# Patient Record
Sex: Male | Born: 1970 | Race: Black or African American | Hispanic: No | Marital: Single | Smoking: Never smoker
Health system: Southern US, Community
[De-identification: ages and names within clinical notes are randomized; demographics above are authoritative.]

## PROBLEM LIST (undated history)

## (undated) DIAGNOSIS — N186 End stage renal disease: Secondary | ICD-10-CM

## (undated) DIAGNOSIS — Z992 Dependence on renal dialysis: Secondary | ICD-10-CM

## (undated) DIAGNOSIS — I10 Essential (primary) hypertension: Secondary | ICD-10-CM

## (undated) HISTORY — PX: SP DECLOT AVGG: HXRAD28

---

## 2011-04-28 HISTORY — PX: DIALYSIS FISTULA CREATION: SHX611

## 2014-04-27 HISTORY — PX: SHUNTOGRAM: SHX6095

## 2014-05-23 ENCOUNTER — Ambulatory Visit (HOSPITAL_COMMUNITY)
Admission: RE | Admit: 2014-05-23 | Discharge: 2014-05-23 | Disposition: A | Discharge status: Home / Self Care | Payer: Medicaid Other | Source: Ambulatory Visit | Attending: Nephrology | Admitting: Nephrology

## 2014-05-23 ENCOUNTER — Emergency Department (HOSPITAL_COMMUNITY)
Admission: EM | Admit: 2014-05-23 | Discharge: 2014-05-23 | Disposition: A | Discharge status: Home / Self Care | Payer: Self-pay | Attending: Emergency Medicine | Admitting: Emergency Medicine

## 2014-05-23 ENCOUNTER — Other Ambulatory Visit (HOSPITAL_COMMUNITY): Payer: Self-pay | Admitting: Nephrology

## 2014-05-23 ENCOUNTER — Encounter (HOSPITAL_COMMUNITY): Payer: Self-pay | Admitting: Family Medicine

## 2014-05-23 DIAGNOSIS — T82898A Other specified complication of vascular prosthetic devices, implants and grafts, initial encounter: Secondary | ICD-10-CM | POA: Insufficient documentation

## 2014-05-23 DIAGNOSIS — T85618A Breakdown (mechanical) of other specified internal prosthetic devices, implants and grafts, initial encounter: Secondary | ICD-10-CM

## 2014-05-23 DIAGNOSIS — Y838 Other surgical procedures as the cause of abnormal reaction of the patient, or of later complication, without mention of misadventure at the time of the procedure: Secondary | ICD-10-CM | POA: Diagnosis not present

## 2014-05-23 DIAGNOSIS — Z5321 Procedure and treatment not carried out due to patient leaving prior to being seen by health care provider: Secondary | ICD-10-CM

## 2014-05-23 HISTORY — DX: Essential (primary) hypertension: I10

## 2014-05-23 MED ORDER — FENTANYL CITRATE 0.05 MG/ML IJ SOLN
INTRAMUSCULAR | Status: AC
  Filled 2014-05-23: qty 4

## 2014-05-23 MED ORDER — MIDAZOLAM HCL 2 MG/2ML IJ SOLN
INTRAMUSCULAR | Status: AC
  Filled 2014-05-23: qty 4

## 2014-05-23 MED ORDER — FENTANYL CITRATE 0.05 MG/ML IJ SOLN
INTRAMUSCULAR | Status: AC | PRN
  Administered 2014-05-23: 50 ug via INTRAVENOUS
  Administered 2014-05-23: 25 ug via INTRAVENOUS

## 2014-05-23 MED ORDER — IOHEXOL 300 MG/ML  SOLN
150.0000 mL | Freq: Once | INTRAMUSCULAR | Status: AC | PRN
  Administered 2014-05-23: 60 mL via INTRAVENOUS

## 2014-05-23 MED ORDER — LIDOCAINE HCL 1 % IJ SOLN
INTRAMUSCULAR | Status: AC
  Filled 2014-05-23: qty 20

## 2014-05-23 NOTE — Progress Notes (Signed)
Pt transported to Short Stay, P5, via wheelchair and stated he needed to leave as soon as his ride arrived and was trying to put on his clothing while the short stay nurse was trying to get his vitals.  Pt had been told in IR in my presence that his discharge time was 1800. Pt and brother stated if the ride called he would have to leave.  Pt then showed he was wearing a house arrest monitor and brother stated if he did not leave when ride arrived, he would not have another ride for hours.  Called Dr. Loreta AveWagner and advised.  No new orders, but asked if pt could at least stay until 1745 and have drink prior to discharge. Pt denied nausea, dressing to arm is dry and intact. Nurse taking over care having tech bring pt beverage.

## 2014-05-23 NOTE — ED Notes (Signed)
Pt presents from home via [facility] with c/o fistula issue. Pt reports was in prison x28 years and was recently released and has not had dialysis in 1week.  Pt reports the nurses that cared for him in prison said that the fistula needs to be "cleaned out" before it is used again. Able to palpate bruit, skin is CDI.

## 2014-05-23 NOTE — Sedation Documentation (Signed)
Discussed no current labs or IV access prior to procedure start w/ Dr. Loreta AveWagner.  Pt stated his uncle is supposed to pick him up.  Pt noted to have wetish sounding cough.

## 2014-05-23 NOTE — Procedures (Signed)
Interventional Radiology Procedure Note  Procedure: Left UE graft - o - gram, with outflow stenosis and collateral filling.  Balloon angioplasty of the site of anastamosis with 6mm and 8mm balloon to 18ATM.  Improved outflow, with palpable thrill at the completion of procedure. .  Complications: No immediate Recommendations:  - Ok to use the graft/fistula. - Routine care   Signed,  Yvone NeuJaime S. Loreta AveWagner, DO

## 2014-05-23 NOTE — Sedation Documentation (Signed)
Patient is resting comfortably. 

## 2014-05-23 NOTE — H&P (Signed)
Chief Complaint: Chief Complaint  Patient presents with  . Vascular Access Problem  ESRD  Referring Physician(s): Dr Deterding  History of Present Illness: Andre Gentry is a 44 y.o. male  ESRD Left arm dialysis fistula placed 2013 per records Pt has been released from prison after 28 yrs Has not had dialysis 1 week [facility] Deterding Noted Increased venous pressure Scheduled for shuntogram in IR: + stenosis Now scheduled for angioplasty/stent with possible dialysis catheter placement if needed   Past Medical History  Diagnosis Date  . Hypertension   . Renal failure     Past Surgical History  Procedure Laterality Date  . Dialysis fistula creation Left 2013    LUE    Allergies: Review of patient's allergies indicates no known allergies.  Medications: Prior to Admission medications   Not on File    History reviewed. No pertinent family history.  History   Social History  . Marital Status: Unknown    Spouse Name: N/A    Number of Children: N/A  . Years of Education: N/A   Social History Main Topics  . Smoking status: Never Smoker   . Smokeless tobacco: None  . Alcohol Use: No  . Drug Use: None  . Sexual Activity: None   Other Topics Concern  . None   Social History Narrative  . None     Review of Systems: A 12 point ROS discussed and pertinent positives are indicated in the HPI above.  All other systems are negative.  Review of Systems  Constitutional: Positive for activity change.  Respiratory: Positive for cough. Negative for shortness of breath.   Cardiovascular: Negative for chest pain.  Psychiatric/Behavioral: Negative for behavioral problems and confusion.    Vital Signs: BP 152/97 mmHg  Pulse 81  Temp(Src) 97.5 F (36.4 C) (Oral)  Resp 20  SpO2 100%  Physical Exam  Constitutional: He is oriented to person, place, and time.  Cardiovascular: Normal rate, regular rhythm and normal heart sounds.     Pulmonary/Chest: Effort normal. He has wheezes.  Abdominal: Soft. Bowel sounds are normal. There is no tenderness.  Musculoskeletal: Normal range of motion.  Left arm fistula intact  Neurological: He is alert and oriented to person, place, and time.  Skin: Skin is warm and dry.  Psychiatric: He has a normal mood and affect. His behavior is normal. Judgment and thought content normal.    Imaging: No results found.  Labs:  CBC: No results for input(s): WBC, HGB, HCT, PLT in the last 8760 hours.  COAGS: No results for input(s): INR, APTT in the last 8760 hours.  BMP: No results for input(s): NA, K, CL, CO2, GLUCOSE, BUN, CALCIUM, CREATININE, GFRNONAA, GFRAA in the last 8760 hours.  Invalid input(s): CMP  LIVER FUNCTION TESTS: No results for input(s): BILITOT, AST, ALT, ALKPHOS, PROT, ALBUMIN in the last 8760 hours.  TUMOR MARKERS: No results for input(s): AFPTM, CEA, CA199, CHROMGRNA in the last 8760 hours.  Assessment and Plan:  Increased venous pressure noted in most recent dialysis session shuntogram of L arm dialysis fistula today reveals stenosis Now scheduled for angioplasty/stent placement possible dialysis catheter placement if needed Pt aware of procedure benefits and risks and agreeable to proceed Consent signed andin chart  Thank you for this interesting consult.  I greatly enjoyed meeting Andre Gentry and look forward to participating in their care.  Signed: Kaitlynne Wenz A 05/23/2014, 4:17 PM   I spent a total of 20 minutes face  to face in clinical consultation, greater than 50% of which was counseling/coordinating care for L arm dialysis fistula pta/stnet ot dialysis catheter placement

## 2014-05-23 NOTE — Progress Notes (Signed)
Pt declines to stay for 1 hour, IR nurse called MD, pt may leave when his ride arrives. Pt eating, left arm site dry/ clean, dressing intact. Pt only has one ride available, has ankle bractlet/monitor and has to return by 6pm. Pt is eating.

## 2014-05-23 NOTE — Discharge Instructions (Signed)
°  Refer to this sheet in the next few weeks. These instructions provide you with information on caring for yourself after your procedure. Your caregiver may also give you more specific instructions. Your treatment has been planned according to current medical practices, but problems sometimes occur. Call your caregiver if you have any problems or questions after your procedure. HOME CARE INSTRUCTIONS  You may shower the day after the procedure.Remove the bandage (dressing) and gently wash the site with plain soap and water.Gently pat the site dry.  Do not apply powder or lotion to the site.  Do not submerge the affected site in water for 3 to 5 days.  Inspect the site at least twice daily.  Do not flex or bend the affected arm for 24 hours.  No lifting over 5 pounds (2.3 kg) for 5 days after your procedure.  Do not drive home if you are discharged the same day of the procedure. Have someone else drive you.  You may drive 24 hours after the procedure unless otherwise instructed by your caregiver.  Do not operate machinery or power tools for 24 hours.  A responsible adult should be with you for the first 24 hours after you arrive home. What to expect:  Any bruising will usually fade within 1 to 2 weeks.  Blood that collects in the tissue (hematoma) may be painful to the touch. It should usually decrease in size and tenderness within 1 to 2 weeks. SEEK IMMEDIATE MEDICAL CARE IF:  You have unusual pain at the radial site.  You have redness, warmth, swelling, or pain at the radial site.  You have drainage (other than a small amount of blood on the dressing).  You have chills.  You have a fever or persistent symptoms for more than 72 hours.  You have a fever and your symptoms suddenly get worse.  Your arm becomes pale, cool, tingly, or numb.  You have heavy bleeding from the site. Hold pressure on the site. Document Released: 05/16/2010 Document Revised: 07/06/2011 Document  Reviewed: 05/16/2010 [facility] Patient Information 2015 [employer] [place]. This information is not intended to replace advice given to you by your health care provider. Make sure you discuss any questions you have with your health care provider.

## 2014-05-23 NOTE — Sedation Documentation (Signed)
Dr. Loreta AveWagner discussing procedure outcome w/ patient and answering questions.  Sutures placed to site by MD.

## 2014-05-25 NOTE — ED Provider Notes (Signed)
Pt accidentally came to the ED.  He refused medical screening exam.  Not evaluated as patient.    Andre MoMatthew Cris Gibby, MD 05/25/14 [phone]

## 2015-01-05 ENCOUNTER — Emergency Department (HOSPITAL_COMMUNITY)
Admission: EM | Admit: 2015-01-05 | Discharge: 2015-01-05 | Discharge status: Against Medical Advice | Payer: Medicaid Other | Attending: Emergency Medicine | Admitting: Emergency Medicine

## 2015-01-05 ENCOUNTER — Encounter (HOSPITAL_COMMUNITY): Payer: Self-pay | Admitting: Emergency Medicine

## 2015-01-05 DIAGNOSIS — I120 Hypertensive chronic kidney disease with stage 5 chronic kidney disease or end stage renal disease: Secondary | ICD-10-CM | POA: Diagnosis not present

## 2015-01-05 DIAGNOSIS — Z4931 Encounter for adequacy testing for hemodialysis: Secondary | ICD-10-CM | POA: Diagnosis present

## 2015-01-05 DIAGNOSIS — N186 End stage renal disease: Secondary | ICD-10-CM | POA: Diagnosis not present

## 2015-01-05 LAB — BASIC METABOLIC PANEL
ANION GAP: 23 — AB (ref 5–15)
BUN: 76 mg/dL — ABNORMAL HIGH (ref 6–20)
CALCIUM: 6.9 mg/dL — AB (ref 8.9–10.3)
CO2: 15 mmol/L — ABNORMAL LOW (ref 22–32)
Chloride: 99 mmol/L — ABNORMAL LOW (ref 101–111)
Creatinine, Ser: 24.48 mg/dL — ABNORMAL HIGH (ref 0.61–1.24)
GFR calc non Af Amer: 2 mL/min — ABNORMAL LOW (ref >60)
GFR, EST AFRICAN AMERICAN: 2 mL/min — AB (ref >60)
GLUCOSE: 75 mg/dL (ref 65–99)
Potassium: 7 mmol/L (ref 3.5–5.1)
Sodium: 137 mmol/L (ref 135–145)

## 2015-01-05 LAB — CBC
HCT: 33.5 % — ABNORMAL LOW (ref 39.0–52.0)
HEMOGLOBIN: 11.1 g/dL — AB (ref 13.0–17.0)
MCH: 31.7 pg (ref 26.0–34.0)
MCHC: 33.1 g/dL (ref 30.0–36.0)
MCV: 95.7 fL (ref 78.0–100.0)
Platelets: 156 10*3/uL (ref 150–400)
RBC: 3.5 MIL/uL — AB (ref 4.22–5.81)
RDW: 14.8 % (ref 11.5–15.5)
WBC: 2.5 10*3/uL — ABNORMAL LOW (ref 4.0–10.5)

## 2015-01-05 NOTE — ED Notes (Signed)
No answer from pt in waiting room 

## 2015-01-05 NOTE — ED Notes (Addendum)
Pt reports that he went to dialysis today and they told him that his graft was clotted. Pt alert x 4. Pt reports he had his last dialysis treatment on Tuesday. No Bruit or Thrill present. Pt also reports feeling slightly light headed.

## 2015-01-07 ENCOUNTER — Other Ambulatory Visit: Payer: Self-pay | Admitting: Radiology

## 2015-01-07 ENCOUNTER — Other Ambulatory Visit (HOSPITAL_COMMUNITY): Payer: Self-pay | Admitting: Nephrology

## 2015-01-07 DIAGNOSIS — N186 End stage renal disease: Secondary | ICD-10-CM

## 2015-01-08 ENCOUNTER — Encounter (HOSPITAL_COMMUNITY): Payer: Self-pay

## 2015-01-08 ENCOUNTER — Other Ambulatory Visit: Payer: Self-pay | Admitting: Nephrology

## 2015-01-08 ENCOUNTER — Other Ambulatory Visit (HOSPITAL_COMMUNITY): Payer: Medicaid Other

## 2015-01-08 ENCOUNTER — Other Ambulatory Visit (HOSPITAL_COMMUNITY): Payer: Self-pay | Admitting: Nephrology

## 2015-01-08 ENCOUNTER — Ambulatory Visit (HOSPITAL_COMMUNITY)
Admission: RE | Admit: 2015-01-08 | Discharge: 2015-01-08 | Disposition: A | Discharge status: Home / Self Care | Payer: Medicaid Other | Source: Ambulatory Visit | Attending: Nephrology | Admitting: Nephrology

## 2015-01-08 DIAGNOSIS — Y832 Surgical operation with anastomosis, bypass or graft as the cause of abnormal reaction of the patient, or of later complication, without mention of misadventure at the time of the procedure: Secondary | ICD-10-CM | POA: Insufficient documentation

## 2015-01-08 DIAGNOSIS — I120 Hypertensive chronic kidney disease with stage 5 chronic kidney disease or end stage renal disease: Secondary | ICD-10-CM | POA: Diagnosis not present

## 2015-01-08 DIAGNOSIS — Z992 Dependence on renal dialysis: Secondary | ICD-10-CM | POA: Insufficient documentation

## 2015-01-08 DIAGNOSIS — N186 End stage renal disease: Secondary | ICD-10-CM

## 2015-01-08 DIAGNOSIS — T82868A Thrombosis of vascular prosthetic devices, implants and grafts, initial encounter: Secondary | ICD-10-CM | POA: Insufficient documentation

## 2015-01-08 DIAGNOSIS — Z79899 Other long term (current) drug therapy: Secondary | ICD-10-CM | POA: Insufficient documentation

## 2015-01-08 LAB — BASIC METABOLIC PANEL
Anion gap: 21 — ABNORMAL HIGH (ref 5–15)
BUN: 108 mg/dL — AB (ref 6–20)
CO2: 14 mmol/L — ABNORMAL LOW (ref 22–32)
CREATININE: 29.57 mg/dL — AB (ref 0.61–1.24)
Calcium: 7.1 mg/dL — ABNORMAL LOW (ref 8.9–10.3)
Chloride: 105 mmol/L (ref 101–111)
GFR calc Af Amer: 2 mL/min — ABNORMAL LOW (ref >60)
GFR calc non Af Amer: 1 mL/min — ABNORMAL LOW (ref >60)
Glucose, Bld: 79 mg/dL (ref 65–99)
Potassium: 6.7 mmol/L (ref 3.5–5.1)
SODIUM: 140 mmol/L (ref 135–145)

## 2015-01-08 LAB — PROTIME-INR
INR: 1.24 (ref 0.00–1.49)
Prothrombin Time: 15.8 seconds — ABNORMAL HIGH (ref 11.6–15.2)

## 2015-01-08 LAB — CBC
HCT: 31.4 % — ABNORMAL LOW (ref 39.0–52.0)
Hemoglobin: 10.4 g/dL — ABNORMAL LOW (ref 13.0–17.0)
MCH: 31.3 pg (ref 26.0–34.0)
MCHC: 33.1 g/dL (ref 30.0–36.0)
MCV: 94.6 fL (ref 78.0–100.0)
PLATELETS: 127 10*3/uL — AB (ref 150–400)
RBC: 3.32 MIL/uL — ABNORMAL LOW (ref 4.22–5.81)
RDW: 14.5 % (ref 11.5–15.5)
WBC: 2.2 10*3/uL — AB (ref 4.0–10.5)

## 2015-01-08 LAB — APTT: aPTT: 46 seconds — ABNORMAL HIGH (ref 24–37)

## 2015-01-08 MED ORDER — IOHEXOL 300 MG/ML  SOLN
100.0000 mL | Freq: Once | INTRAMUSCULAR | Status: DC | PRN
  Administered 2015-01-08: 50 mL via INTRAVENOUS
  Filled 2015-01-08: qty 100

## 2015-01-08 MED ORDER — HEPARIN SODIUM (PORCINE) 1000 UNIT/ML IJ SOLN
3000.0000 [IU] | Freq: Once | INTRAMUSCULAR | Status: AC
  Administered 2015-01-08: 3000 [IU] via INTRAVENOUS

## 2015-01-08 MED ORDER — ALTEPLASE 100 MG IV SOLR
4.0000 mg | Freq: Once | INTRAVENOUS | Status: AC
  Administered 2015-01-08: 4 mg
  Filled 2015-01-08: qty 4

## 2015-01-08 MED ORDER — MIDAZOLAM HCL 2 MG/2ML IJ SOLN
INTRAMUSCULAR | Status: AC
Start: 2015-01-08 — End: 2015-01-09
  Filled 2015-01-08: qty 4

## 2015-01-08 MED ORDER — LIDOCAINE HCL 1 % IJ SOLN
INTRAMUSCULAR | Status: AC
  Filled 2015-01-08: qty 20

## 2015-01-08 MED ORDER — MIDAZOLAM HCL 2 MG/2ML IJ SOLN
INTRAMUSCULAR | Status: DC | PRN
  Administered 2015-01-08: 1 mg via INTRAVENOUS
  Administered 2015-01-08: 0.5 mg via INTRAVENOUS

## 2015-01-08 MED ORDER — FENTANYL CITRATE (PF) 100 MCG/2ML IJ SOLN
INTRAMUSCULAR | Status: AC
  Filled 2015-01-08: qty 4

## 2015-01-08 MED ORDER — SODIUM CHLORIDE 0.9 % IV SOLN
Freq: Once | INTRAVENOUS | Status: AC
  Administered 2015-01-08: 11:00:00 via INTRAVENOUS

## 2015-01-08 MED ORDER — HEPARIN SODIUM (PORCINE) 1000 UNIT/ML IJ SOLN
INTRAMUSCULAR | Status: AC
Start: 2015-01-08 — End: 2015-01-09
  Filled 2015-01-08: qty 1

## 2015-01-08 MED ORDER — FENTANYL CITRATE (PF) 100 MCG/2ML IJ SOLN
INTRAMUSCULAR | Status: DC | PRN
  Administered 2015-01-08: 25 ug via INTRAVENOUS
  Administered 2015-01-08: 50 ug via INTRAVENOUS

## 2015-01-08 NOTE — Progress Notes (Signed)
Pt signed off the machine with 30 min left d/t cramping; pt denies dizziness, weakness, n/v; he ambulated off the unit to go home.

## 2015-01-08 NOTE — Sedation Documentation (Signed)
Patient is resting comfortably. 

## 2015-01-08 NOTE — Progress Notes (Signed)
K+ of 6.7 reported to [employer]. No new orders received.

## 2015-01-08 NOTE — H&P (Signed)
Chief Complaint: Patient was seen in consultation today for clotted dialysis graft at the request of Lin,James W  Referring Physician(s): Lin,James W  History of Present Illness: Andre Gentry is a 44 y.o. male with ESRD. He has had a left UE AVG for a few years and it has worked well for the most past. He did have to have PTA of the venous anastomosis in 1/16.  He last had dialysis last Thurs, no issues, but went Saturday and graft was found to be clotted. He is referred for declot procedure PMHx, meds, allergies reviewed. Pt has been NPO today  Past Medical History  Diagnosis Date  . Hypertension   . Renal failure     Past Surgical History  Procedure Laterality Date  . Dialysis fistula creation Left 2013    LUE    Allergies: Review of patient's allergies indicates no known allergies.  Medications: Prior to Admission medications   Medication Sig Start Date End Date Taking? Authorizing Provider  heparin 1000 unit/mL SOLN injection 2,300 Units by Dialysis route every dialysis.   Yes Historical Provider, MD  IRON SUCROSE IV Inject 100 mg into the vein every dialysis (during dialysis every treatment x 5 times, stop date 2/05/29/14).    Historical Provider, MD  metoprolol tartrate (LOPRESSOR) 25 MG tablet Take 25 mg by mouth 2 (two) times daily.    Historical Provider, MD     History reviewed. No pertinent family history.  Social History   Social History  . Marital Status: Unknown    Spouse Name: N/A  . Number of Children: N/A  . Years of Education: N/A   Social History Main Topics  . Smoking status: Never Smoker   . Smokeless tobacco: None  . Alcohol Use: No  . Drug Use: None  . Sexual Activity: Not Asked   Other Topics Concern  . None   Social History Narrative     Review of Systems: A 12 point ROS discussed and pertinent positives are indicated in the HPI above.  All other systems are negative.  Review of Systems  Vital Signs: BP 143/92 mmHg  Pulse  66  Temp(Src) 97.6 F (36.4 C)  Resp 20  Ht 5\' 11"  (1.803 m)  Wt 182 lb 6 oz (82.725 kg)  BMI 25.45 kg/m2  SpO2 100%  Physical Exam  Constitutional: He is oriented to person, place, and time. He appears well-developed and well-nourished. No distress.  HENT:  Head: Normocephalic.  Mouth/Throat: Oropharynx is clear and moist.  Neck: Normal range of motion. No JVD present. No tracheal deviation present.  Cardiovascular: Normal rate, regular rhythm and normal heart sounds.   Pulmonary/Chest: Effort normal and breath sounds normal. No respiratory distress.  Abdominal: Soft.  Musculoskeletal:  (L)Upper arm loop graft palpable, no pulse or thrill.  Neurological: He is alert and oriented to person, place, and time.  Psychiatric: He has a normal mood and affect. Judgment normal.    Mallampati Score:  MD Evaluation Airway: WNL Heart: WNL Abdomen: WNL Chest/ Lungs: WNL ASA  Classification: 3 Mallampati/Airway Score: One  Imaging: No results found.  Labs:  CBC:  Recent Labs  01/05/15 1326 01/08/15 1115  WBC 2.5* 2.2*  HGB 11.1* 10.4*  HCT 33.5* 31.4*  PLT 156 127*    COAGS:  Recent Labs  01/08/15 1115  INR 1.24  APTT 46*    BMP:  Recent Labs  01/05/15 1326 01/08/15 1115  NA 137 140  K 7.0* 6.7*  CL 99* 105  CO2 15* 14*  GLUCOSE 75 79  BUN 76* 108*  CALCIUM 6.9* 7.1*  CREATININE 24.48* 29.57*  GFRNONAA 2* 1*  GFRAA 2* 2*    Assessment and Plan: Clotted left upper arm AVG Discussed thrombolysis/thrombectomy of AV Graft/Fistula, possible angioplasty, possible stent, possible HD catheter placement if necessary. Risks, benefits, use of sedation thoroughly explained.   SignedDAKSH, COATES 01/08/2015, 11:54 AM   I spent a total of 15 minutes in face to face in clinical consultation, greater than 50% of which was counseling/coordinating care for declotting of AV graft

## 2015-01-08 NOTE — Procedures (Signed)
Technically successful left upper extremity declot procedure.  No immediate complications.  Katherina Right, MD Pager #: [phone]

## 2015-01-08 NOTE — Sedation Documentation (Signed)
Due to pt potassium 6.7 and no hemodialysis since Thursday 01/03/15 pt will be transported to hemodialysis within hospital. Called to [facility], they are not able to accommodate pt this afternoon due to pt volume and time restraints.

## 2015-02-18 ENCOUNTER — Other Ambulatory Visit (HOSPITAL_COMMUNITY): Payer: Self-pay | Admitting: Nephrology

## 2015-02-18 ENCOUNTER — Ambulatory Visit (HOSPITAL_COMMUNITY)
Admission: RE | Admit: 2015-02-18 | Discharge: 2015-02-18 | Disposition: A | Discharge status: Home / Self Care | Payer: Medicaid Other | Source: Ambulatory Visit | Attending: Nephrology | Admitting: Nephrology

## 2015-02-18 ENCOUNTER — Emergency Department (HOSPITAL_COMMUNITY)
Admission: EM | Admit: 2015-02-18 | Discharge: 2015-02-18 | Discharge status: Absent without leave | Payer: Medicaid Other

## 2015-02-18 DIAGNOSIS — N186 End stage renal disease: Secondary | ICD-10-CM

## 2015-02-18 DIAGNOSIS — T82868A Thrombosis of vascular prosthetic devices, implants and grafts, initial encounter: Secondary | ICD-10-CM | POA: Diagnosis not present

## 2015-02-18 DIAGNOSIS — T82858A Stenosis of vascular prosthetic devices, implants and grafts, initial encounter: Secondary | ICD-10-CM | POA: Insufficient documentation

## 2015-02-18 DIAGNOSIS — Y832 Surgical operation with anastomosis, bypass or graft as the cause of abnormal reaction of the patient, or of later complication, without mention of misadventure at the time of the procedure: Secondary | ICD-10-CM | POA: Diagnosis not present

## 2015-02-18 DIAGNOSIS — I120 Hypertensive chronic kidney disease with stage 5 chronic kidney disease or end stage renal disease: Secondary | ICD-10-CM | POA: Diagnosis not present

## 2015-02-18 DIAGNOSIS — Z992 Dependence on renal dialysis: Secondary | ICD-10-CM | POA: Diagnosis not present

## 2015-02-18 LAB — BASIC METABOLIC PANEL
Anion gap: 13 (ref 5–15)
BUN: 41 mg/dL — AB (ref 6–20)
CHLORIDE: 101 mmol/L (ref 101–111)
CO2: 27 mmol/L (ref 22–32)
Calcium: 9.2 mg/dL (ref 8.9–10.3)
Creatinine, Ser: 15.43 mg/dL — ABNORMAL HIGH (ref 0.61–1.24)
GFR calc non Af Amer: 3 mL/min — ABNORMAL LOW (ref >60)
GFR, EST AFRICAN AMERICAN: 4 mL/min — AB (ref >60)
Glucose, Bld: 77 mg/dL (ref 65–99)
POTASSIUM: 5.1 mmol/L (ref 3.5–5.1)
SODIUM: 141 mmol/L (ref 135–145)

## 2015-02-18 LAB — CBC
HCT: 32.4 % — ABNORMAL LOW (ref 39.0–52.0)
Hemoglobin: 10.9 g/dL — ABNORMAL LOW (ref 13.0–17.0)
MCH: 31.3 pg (ref 26.0–34.0)
MCHC: 33.6 g/dL (ref 30.0–36.0)
MCV: 93.1 fL (ref 78.0–100.0)
PLATELETS: 137 10*3/uL — AB (ref 150–400)
RBC: 3.48 MIL/uL — AB (ref 4.22–5.81)
RDW: 14.6 % (ref 11.5–15.5)
WBC: 2.2 10*3/uL — ABNORMAL LOW (ref 4.0–10.5)

## 2015-02-18 LAB — APTT: aPTT: 28 seconds (ref 24–37)

## 2015-02-18 LAB — PROTIME-INR
INR: 1.11 (ref 0.00–1.49)
Prothrombin Time: 14.5 seconds (ref 11.6–15.2)

## 2015-02-18 MED ORDER — LIDOCAINE HCL 1 % IJ SOLN
INTRAMUSCULAR | Status: AC
  Filled 2015-02-18: qty 20

## 2015-02-18 MED ORDER — MIDAZOLAM HCL 2 MG/2ML IJ SOLN
INTRAMUSCULAR | Status: AC
  Filled 2015-02-18: qty 2

## 2015-02-18 MED ORDER — MIDAZOLAM HCL 2 MG/2ML IJ SOLN
INTRAMUSCULAR | Status: AC | PRN
  Administered 2015-02-18: 1 mg via INTRAVENOUS

## 2015-02-18 MED ORDER — FENTANYL CITRATE (PF) 100 MCG/2ML IJ SOLN
INTRAMUSCULAR | Status: AC | PRN
  Administered 2015-02-18 (×2): 50 ug via INTRAVENOUS

## 2015-02-18 MED ORDER — FENTANYL CITRATE (PF) 100 MCG/2ML IJ SOLN
INTRAMUSCULAR | Status: AC
  Filled 2015-02-18: qty 2

## 2015-02-18 MED ORDER — IOHEXOL 300 MG/ML  SOLN
100.0000 mL | Freq: Once | INTRAMUSCULAR | Status: DC | PRN
  Administered 2015-02-18: 50 mL via INTRAVENOUS
  Filled 2015-02-18: qty 100

## 2015-02-18 MED ORDER — SODIUM CHLORIDE 0.9 % IV SOLN
INTRAVENOUS | Status: AC | PRN
  Administered 2015-02-18: 10 mL/h via INTRAVENOUS

## 2015-02-18 MED ORDER — ALTEPLASE 100 MG IV SOLR
4.0000 mg | Freq: Once | INTRAVENOUS | Status: AC
  Administered 2015-02-18: 4 mg
  Filled 2015-02-18: qty 4

## 2015-02-18 MED ORDER — SODIUM CHLORIDE 0.9 % IV SOLN
Freq: Once | INTRAVENOUS | Status: AC
  Administered 2015-02-18: 14:00:00 via INTRAVENOUS

## 2015-02-18 NOTE — Sedation Documentation (Signed)
Patient is resting comfortably. 

## 2015-02-18 NOTE — Sedation Documentation (Signed)
Pt to IR #1 from short stay via stretcher. Awake and alert. In no distress.

## 2015-02-18 NOTE — H&P (Signed)
Chief Complaint: Patient was seen in consultation today for Left upper arm dialysis graft declot at the request of Andre Gentry  Referring Physician(s): Andre Gentry  History of Present Illness: Andre Gentry is a 44 y.o. male   Pt noticed no thrill or pulse in Lt arm graft this am Last used Sat 10/22---no issues Last intervention 01/08/15:IMPRESSION: 1. Technically successful left upper arm AV graft thrombolysis. 2. Successful angioplasty of the venous anastomosis and majority of the venous limb to 7mm diameter. Completion shuntogram demonstrates the venous limb and anastomosis are widely patent. 3. The arterial anastomosis and arterial limb are widely patent. 4. The central venous system is widely patent.  now clotted again Request for thrombolysis/thrombectomy with possible angioplasty/stent placement Possible dialysis catheter placement if needed.  Past Medical History  Diagnosis Date  . Hypertension   . Renal failure     Past Surgical History  Procedure Laterality Date  . Dialysis fistula creation Left 2013    LUE    Allergies: Review of patient's allergies indicates no known allergies.  Medications: Prior to Admission medications   Medication Sig Start Date End Date Taking? Authorizing Provider  heparin 1000 unit/mL SOLN injection 2,300 Units by Dialysis route every dialysis.    Historical Provider, MD  IRON SUCROSE IV Inject 100 mg into the vein every dialysis (during dialysis every treatment x 5 times, stop date 2/05/29/14).    Historical Provider, MD  metoprolol tartrate (LOPRESSOR) 25 MG tablet Take 25 mg by mouth 2 (two) times daily.    Historical Provider, MD     No family history on file.  Social History   Social History  . Marital Status: Unknown    Spouse Name: N/A  . Number of Children: N/A  . Years of Education: N/A   Social History Main Topics  . Smoking status: Never Smoker   . Smokeless tobacco: Not on file  . Alcohol Use: No  .  Drug Use: Not on file  . Sexual Activity: Not on file   Other Topics Concern  . Not on file   Social History Narrative     Review of Systems: A 12 point ROS discussed and pertinent positives are indicated in the HPI above.  All other systems are negative.  Review of Systems  Constitutional: Negative for fever and fatigue.  Respiratory: Negative for cough and shortness of breath.   Neurological: Negative for weakness.  Psychiatric/Behavioral: Negative for behavioral problems and confusion.    Vital Signs: BP 135/94 mmHg  Pulse 69  Temp(Src) 97.5 F (36.4 C) (Oral)  Resp 18  Ht  (1.803 m)  Wt 175 lb (79.379 kg)  BMI 24.42 kg/m2  SpO2 100%  Physical Exam  Constitutional: He is oriented to person, place, and time.  Cardiovascular: Normal rate, regular rhythm and normal heart sounds.   Pulmonary/Chest: Effort normal and breath sounds normal. He has no wheezes.  Abdominal: Soft. Bowel sounds are normal. There is no tenderness.  Musculoskeletal: Normal range of motion.  LUE dialysis graft no thrill/ no pulse  Neurological: He is alert and oriented to person, place, and time.  Skin: Skin is warm and dry.  Psychiatric: He has a normal mood and affect. His behavior is normal. Judgment and thought content normal.  Nursing note and vitals reviewed.   Mallampati Score:  MD Evaluation Airway: WNL Heart: WNL Abdomen: WNL Chest/ Lungs: WNL ASA  Classification: 3 Mallampati/Airway Score: One  Imaging: No results found.  Labs:  CBC:  Recent Labs  01/05/15 1326 01/08/15 1115 02/18/15 1357  WBC 2.5* 2.2* 2.2*  HGB 11.1* 10.4* 10.9*  HCT 33.5* 31.4* 32.4*  PLT 156 127* 137*    COAGS:  Recent Labs  01/08/15 1115  INR 1.24  APTT 46*    BMP:  Recent Labs  01/05/15 1326 01/08/15 1115  NA 137 140  K 7.0* 6.7*  CL 99* 105  CO2 15* 14*  GLUCOSE 75 79  BUN 76* 108*  CALCIUM 6.9* 7.1*  CREATININE 24.48* 29.57*  GFRNONAA 2* 1*  GFRAA 2* 2*     LIVER FUNCTION TESTS: No results for input(s): BILITOT, AST, ALT, ALKPHOS, PROT, ALBUMIN in the last 8760 hours.  TUMOR MARKERS: No results for input(s): AFPTM, CEA, CA199, CHROMGRNA in the last 8760 hours.  Assessment and Plan:  Clotted LUE dialysis graft Now scheduled for thrombolysis/thrombectomy with poss pta/stent. Poss dialysis catheter placement Risks and Benefits discussed with the patient including, but not limited to bleeding, infection, vascular injury, pulmonary embolism, need for tunneled HD catheter placement or even death. All of the patient's questions were answered, patient is agreeable to proceed. Consent signed and in chart.   Thank you for this interesting consult.  I greatly enjoyed meeting Andre Gentry and look forward to participating in their care.  A copy of this report was sent to the requesting provider on this date.  Signed: [place] Gentry A 02/18/2015, 2:33 PM   I spent a total of  30 Minutes   in face to face in clinical consultation, greater than 50% of which was counseling/coordinating care for dialysis graft declot

## 2015-02-18 NOTE — Discharge Instructions (Signed)
Fistulogram, Care After °Refer to this sheet in the next few weeks. These instructions provide you with information on caring for yourself after your procedure. Your health care provider may also give you more specific instructions. Your treatment has been planned according to current medical practices, but problems sometimes occur. Call your health care provider if you have any problems or questions after your procedure. °WHAT TO EXPECT AFTER THE PROCEDURE °After your procedure, it is typical to have the following: °· A small amount of discomfort in the area where the catheters were placed. °· A small amount of bruising around the fistula. °· Sleepiness and fatigue. °HOME CARE INSTRUCTIONS °· Rest at home for the day following your procedure. °· Do not drive or operate heavy machinery while taking pain medicine. °· Take medicines only as directed by your health care provider. °· Do not take baths, swim, or use a hot tub until your health care provider approves. You may shower 24 hours after the procedure or as directed by your health care provider. °· There are many different ways to close and cover an incision, including stitches, skin glue, and adhesive strips. Follow your health care provider's instructions on: °¨ Incision care. °¨ Bandage (dressing) changes and removal. °¨ Incision closure removal. °· Monitor your dialysis fistula carefully. °SEEK MEDICAL CARE IF: °· You have drainage, redness, swelling, or pain at your catheter site. °· You have a fever. °· You have chills. °SEEK IMMEDIATE MEDICAL CARE IF: °· You feel weak. °· You have trouble balancing. °· You have trouble moving your arms or legs. °· You have problems with your speech or vision. °· You can no longer feel a vibration or buzz when you put your fingers over your dialysis fistula. °· The limb that was used for the procedure: °¨ Swells. °¨ Is painful. °¨ Is cold. °¨ Is discolored, such as blue or pale white. °  °This information is not intended  to replace advice given to you by your health care provider. Make sure you discuss any questions you have with your health care provider. °  °Document Released: 08/28/2013 Document Reviewed: 08/28/2013 °Elsevier Interactive Patient Education ©2016 [employer] ° °

## 2015-02-18 NOTE — Sedation Documentation (Signed)
Pt requesting pain pills for after procedure. 50mcg fentanyl given per Dr. Loreta AveWagner.

## 2015-02-18 NOTE — Procedures (Signed)
Interventional Radiology Procedure Note  Procedure: LUE AV graft declot.  7mm & 9mm plasty on the venous end.  7mm plasty on the arterial anastamosis.  No residual stenosis at the conclusion, with palpable thrill. .  Complications: None Recommendations:  - Ok for dialysis - Dialysis may remove the stay sutures.  - 1 hour observation - Routine care  Signed,  Yvone NeuJaime S. Loreta AveWagner, DO

## 2015-03-08 ENCOUNTER — Emergency Department (HOSPITAL_COMMUNITY)
Admission: EM | Admit: 2015-03-08 | Discharge: 2015-03-08 | Disposition: A | Discharge status: Home / Self Care | Payer: Medicaid Other | Attending: Emergency Medicine | Admitting: Emergency Medicine

## 2015-03-08 ENCOUNTER — Emergency Department (HOSPITAL_COMMUNITY): Payer: Medicaid Other

## 2015-03-08 ENCOUNTER — Encounter (HOSPITAL_COMMUNITY): Payer: Self-pay | Admitting: Family Medicine

## 2015-03-08 DIAGNOSIS — Z79899 Other long term (current) drug therapy: Secondary | ICD-10-CM | POA: Diagnosis not present

## 2015-03-08 DIAGNOSIS — T82868A Thrombosis of vascular prosthetic devices, implants and grafts, initial encounter: Secondary | ICD-10-CM

## 2015-03-08 DIAGNOSIS — Z87448 Personal history of other diseases of urinary system: Secondary | ICD-10-CM | POA: Diagnosis not present

## 2015-03-08 DIAGNOSIS — L988 Other specified disorders of the skin and subcutaneous tissue: Secondary | ICD-10-CM | POA: Diagnosis present

## 2015-03-08 DIAGNOSIS — Y658 Other specified misadventures during surgical and medical care: Secondary | ICD-10-CM | POA: Insufficient documentation

## 2015-03-08 DIAGNOSIS — I10 Essential (primary) hypertension: Secondary | ICD-10-CM | POA: Insufficient documentation

## 2015-03-08 MED ORDER — FENTANYL CITRATE (PF) 100 MCG/2ML IJ SOLN
INTRAMUSCULAR | Status: AC
  Filled 2015-03-08: qty 2

## 2015-03-08 MED ORDER — LIDOCAINE HCL 1 % IJ SOLN
INTRAMUSCULAR | Status: AC
  Filled 2015-03-08: qty 20

## 2015-03-08 MED ORDER — HEPARIN SODIUM (PORCINE) 1000 UNIT/ML IJ SOLN
INTRAMUSCULAR | Status: AC
  Filled 2015-03-08: qty 1

## 2015-03-08 MED ORDER — ALTEPLASE 2 MG IJ SOLR
2.0000 mg | Freq: Once | INTRAMUSCULAR | Status: DC
  Filled 2015-03-08: qty 2

## 2015-03-08 MED ORDER — SODIUM CHLORIDE 0.9 % IV SOLN
Freq: Once | INTRAVENOUS | Status: DC
Start: 2015-03-08 — End: 2015-03-08

## 2015-03-08 MED ORDER — IOHEXOL 300 MG/ML  SOLN
100.0000 mL | Freq: Once | INTRAMUSCULAR | Status: DC | PRN
  Administered 2015-03-08: 50 mL via INTRAVENOUS
  Filled 2015-03-08: qty 100

## 2015-03-08 MED ORDER — HEPARIN SODIUM (PORCINE) 1000 UNIT/ML IJ SOLN
3000.0000 [IU] | Freq: Once | INTRAMUSCULAR | Status: AC
  Administered 2015-03-08: 3000 [IU] via INTRA_ARTERIAL

## 2015-03-08 NOTE — Sedation Documentation (Signed)
Patient is resting comfortably. No sedation given. 

## 2015-03-08 NOTE — Consult Note (Signed)
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 CHROMGRNA in the last 8760 hours.  Assessment and Plan:  ESRD Left upper arm dialysis graft clotted Now scheduled for thrombolysis /thrombectomy with possible angioplasty/stent placement Possible dialysis catheter placement Risks and Benefits discussed with the patient including, but not limited to bleeding, infection, vascular injury, pulmonary embolism, need for tunneled HD catheter placement or even death. All of the patient's questions were answered, patient is agreeable to proceed. Consent signed and in chart.      Thank you for this interesting consult.  I greatly enjoyed meeting Andre Gentry and look forward to participating in their care.  A copy of this report was sent to the requesting provider on this date.  Signed: Shaivi Rothschild A 03/08/2015, 1:18 PM   I spent a total of 40 Minutes    in face to face in clinical consultation, greater than 50% of which was counseling/coordinating care for LUE dialysis graft declot

## 2015-03-08 NOTE — ED Notes (Signed)
Pt transported to IR 

## 2015-03-08 NOTE — ED Notes (Signed)
Pt here due to fistula not working. Last dialysis yesterday.
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
## 2015-03-08 NOTE — Discharge Instructions (Signed)
Your fistula was unclogged.  Follow up with your dialysis as previously instructed.  Return with change in fistula function.  AV Fistula, Care After Refer to this sheet in the next few weeks. These instructions provide you with information on caring for yourself after your procedure. Your caregiver may also give you more specific instructions. Your treatment has been planned according to current medical practices, but problems sometimes occur. Call your caregiver if you have any problems or questions after your procedure. HOME CARE INSTRUCTIONS   Do not drive a car or take public transportation alone.  Do not drink alcohol.  Only take medicine that has been prescribed by your caregiver.  Do not sign important papers or make important decisions.  Have a responsible person with you.  Ask your caregiver to show you how to check your access at home for a vibration (called a "thrill") or for a sound (called a "bruit" pronounced brew-ee).  Your vein will need time to enlarge and mature so needles can be inserted for dialysis. Follow your caregiver's instructions about what you need to do to make this happen.  Keep dressings clean and dry.  Keep the arm elevated above your heart. Use a pillow.  Rest.  Use the arm as usual for all activities.  Have the stitches or tape closures removed in 10 to 14 days, or as directed by your caregiver.  Do not sleep or lie on the area of the fistula or that arm. This may decrease or stop the blood flow through your fistula.  Do not allow blood pressures to be taken on this arm.  Do not allow blood drawing to be done from the graft.  Do not wear tight clothing around the access site or on the arm.  Avoid lifting heavy objects with the arm that has the fistula.  Do not use creams or lotions over the access site. SEEK MEDICAL CARE IF:   You have a fever.  You have swelling around the fistula that gets worse, or you have new pain.  You have  unusual bleeding at the fistula site or from any other area.  You have pus or other drainage at the fistula site.  You have skin redness or red streaking on the skin around, above, or below the fistula site.  Your access site feels warm.  You have any flu-like symptoms. SEEK IMMEDIATE MEDICAL CARE IF:   You have pain, numbness, or an unusual pale skin on the hand or on the side of your fistula.  You have dizziness or weakness that you have not had before.  You have shortness of breath.  You have chest pain.  Your fistula disconnects or breaks, and there is bleeding that cannot be easily controlled. Call for local emergency medical help. Do not try to drive yourself to the hospital. MAKE SURE YOU  Understand these instructions.  Will watch your condition.  Will get help right away if you are not doing well or get worse.   This information is not intended to replace advice given to you by your health care provider. Make sure you discuss any questions you have with your health care provider.   Document Released: 04/13/2005 Document Revised: 05/04/2014 Document Reviewed: 10/01/2010 Elsevier Interactive Patient Education [employer] Elsevier Inc.

## 2015-03-08 NOTE — Sedation Documentation (Signed)
Vital signs stable. 

## 2015-03-08 NOTE — Sedation Documentation (Signed)
Patient is resting comfortably. 

## 2015-03-08 NOTE — Procedures (Signed)
Successful LUE AVG DECLOT WITH PTA NO COMP STABLE READY FOR USE FULL REPORT IN PACS

## 2015-04-22 ENCOUNTER — Inpatient Hospital Stay (HOSPITAL_COMMUNITY)
Admission: EM | Admit: 2015-04-22 | Discharge: 2015-04-23 | DRG: 252 | Disposition: A | Discharge status: Home / Self Care | Payer: Medicaid Other | Attending: Internal Medicine | Admitting: Internal Medicine

## 2015-04-22 ENCOUNTER — Encounter (HOSPITAL_COMMUNITY): Payer: Self-pay | Admitting: *Deleted

## 2015-04-22 DIAGNOSIS — I120 Hypertensive chronic kidney disease with stage 5 chronic kidney disease or end stage renal disease: Secondary | ICD-10-CM | POA: Diagnosis present

## 2015-04-22 DIAGNOSIS — N186 End stage renal disease: Secondary | ICD-10-CM | POA: Diagnosis present

## 2015-04-22 DIAGNOSIS — Z992 Dependence on renal dialysis: Secondary | ICD-10-CM

## 2015-04-22 DIAGNOSIS — Z79899 Other long term (current) drug therapy: Secondary | ICD-10-CM | POA: Diagnosis not present

## 2015-04-22 DIAGNOSIS — E875 Hyperkalemia: Secondary | ICD-10-CM | POA: Diagnosis present

## 2015-04-22 DIAGNOSIS — T8249XD Other complication of vascular dialysis catheter, subsequent encounter: Secondary | ICD-10-CM

## 2015-04-22 DIAGNOSIS — Y838 Other surgical procedures as the cause of abnormal reaction of the patient, or of later complication, without mention of misadventure at the time of the procedure: Secondary | ICD-10-CM | POA: Diagnosis present

## 2015-04-22 DIAGNOSIS — T82868A Thrombosis of vascular prosthetic devices, implants and grafts, initial encounter: Secondary | ICD-10-CM | POA: Diagnosis present

## 2015-04-22 DIAGNOSIS — T82898A Other specified complication of vascular prosthetic devices, implants and grafts, initial encounter: Secondary | ICD-10-CM | POA: Diagnosis present

## 2015-04-22 DIAGNOSIS — T82858A Stenosis of vascular prosthetic devices, implants and grafts, initial encounter: Secondary | ICD-10-CM | POA: Diagnosis present

## 2015-04-22 DIAGNOSIS — T8249XA Other complication of vascular dialysis catheter, initial encounter: Secondary | ICD-10-CM | POA: Diagnosis present

## 2015-04-22 HISTORY — DX: Dependence on renal dialysis: Z99.2

## 2015-04-22 HISTORY — DX: End stage renal disease: N18.6

## 2015-04-22 LAB — PROTIME-INR
INR: 1.17 (ref 0.00–1.49)
Prothrombin Time: 15.1 seconds (ref 11.6–15.2)

## 2015-04-22 LAB — POTASSIUM: POTASSIUM: 6.9 mmol/L — AB (ref 3.5–5.1)

## 2015-04-22 LAB — CBC
HEMATOCRIT: 35.9 % — AB (ref 39.0–52.0)
HEMOGLOBIN: 12.2 g/dL — AB (ref 13.0–17.0)
MCH: 32.5 pg (ref 26.0–34.0)
MCHC: 34 g/dL (ref 30.0–36.0)
MCV: 95.7 fL (ref 78.0–100.0)
Platelets: 127 10*3/uL — ABNORMAL LOW (ref 150–400)
RBC: 3.75 MIL/uL — ABNORMAL LOW (ref 4.22–5.81)
RDW: 13.8 % (ref 11.5–15.5)
WBC: 2.5 10*3/uL — AB (ref 4.0–10.5)

## 2015-04-22 LAB — MRSA PCR SCREENING: MRSA by PCR: NEGATIVE

## 2015-04-22 LAB — BASIC METABOLIC PANEL
ANION GAP: 15 (ref 5–15)
BUN: 60 mg/dL — ABNORMAL HIGH (ref 6–20)
CHLORIDE: 100 mmol/L — AB (ref 101–111)
CO2: 25 mmol/L (ref 22–32)
Calcium: 8.8 mg/dL — ABNORMAL LOW (ref 8.9–10.3)
Creatinine, Ser: 17.05 mg/dL — ABNORMAL HIGH (ref 0.61–1.24)
GFR calc non Af Amer: 3 mL/min — ABNORMAL LOW (ref >60)
GFR, EST AFRICAN AMERICAN: 3 mL/min — AB (ref >60)
GLUCOSE: 81 mg/dL (ref 65–99)
POTASSIUM: 6.4 mmol/L — AB (ref 3.5–5.1)
Sodium: 140 mmol/L (ref 135–145)

## 2015-04-22 MED ORDER — SODIUM CHLORIDE 0.9 % IV SOLN: 100.0000 mL | INTRAVENOUS | Status: DC | PRN

## 2015-04-22 MED ORDER — SODIUM CHLORIDE 0.9 % IV SOLN: 1.0000 g | Freq: Once | INTRAVENOUS | Status: DC

## 2015-04-22 MED ORDER — INSULIN ASPART 100 UNIT/ML ~~LOC~~ SOLN
6.0000 [IU] | Freq: Once | SUBCUTANEOUS | Status: AC
  Administered 2015-04-22: 6 [IU] via INTRAVENOUS
  Filled 2015-04-22: qty 1

## 2015-04-22 MED ORDER — HEPARIN SODIUM (PORCINE) 5000 UNIT/ML IJ SOLN
5000.0000 [IU] | Freq: Three times a day (TID) | INTRAMUSCULAR | Status: DC
  Filled 2015-04-22: qty 1

## 2015-04-22 MED ORDER — HYDRALAZINE HCL 50 MG PO TABS
50.0000 mg | ORAL_TABLET | Freq: Three times a day (TID) | ORAL | Status: DC
  Administered 2015-04-22 – 2015-04-23 (×2): 50 mg via ORAL
  Filled 2015-04-22 (×2): qty 1

## 2015-04-22 MED ORDER — LIDOCAINE-PRILOCAINE 2.5-2.5 % EX CREA
1.0000 "application " | TOPICAL_CREAM | CUTANEOUS | Status: DC | PRN
  Filled 2015-04-22: qty 5

## 2015-04-22 MED ORDER — HEPARIN SODIUM (PORCINE) 1000 UNIT/ML DIALYSIS: 1000.0000 [IU] | INTRAMUSCULAR | Status: DC | PRN

## 2015-04-22 MED ORDER — SODIUM BICARBONATE 8.4 % IV SOLN
50.0000 meq | Freq: Once | INTRAVENOUS | Status: AC
  Administered 2015-04-22: 50 meq via INTRAVENOUS
  Filled 2015-04-22: qty 50

## 2015-04-22 MED ORDER — HYDRALAZINE HCL 20 MG/ML IJ SOLN: 10.0000 mg | Freq: Four times a day (QID) | INTRAMUSCULAR | Status: DC | PRN

## 2015-04-22 MED ORDER — GUAIFENESIN-DM 100-10 MG/5ML PO SYRP: 5.0000 mL | ORAL_SOLUTION | ORAL | Status: DC | PRN

## 2015-04-22 MED ORDER — ONDANSETRON HCL 4 MG/2ML IJ SOLN: 4.0000 mg | Freq: Four times a day (QID) | INTRAMUSCULAR | Status: DC | PRN

## 2015-04-22 MED ORDER — SODIUM POLYSTYRENE SULFONATE 15 GM/60ML PO SUSP
30.0000 g | Freq: Once | ORAL | Status: AC
  Administered 2015-04-22: 30 g via ORAL
  Filled 2015-04-22: qty 120

## 2015-04-22 MED ORDER — ALUM & MAG HYDROXIDE-SIMETH 200-200-20 MG/5ML PO SUSP: 30.0000 mL | Freq: Four times a day (QID) | ORAL | Status: DC | PRN

## 2015-04-22 MED ORDER — DEXTROSE 50 % IV SOLN
25.0000 g | Freq: Once | INTRAVENOUS | Status: AC
  Administered 2015-04-22: 25 g via INTRAVENOUS
  Filled 2015-04-22: qty 50

## 2015-04-22 MED ORDER — HYDROCODONE-ACETAMINOPHEN 5-325 MG PO TABS: 1.0000 | ORAL_TABLET | ORAL | Status: DC | PRN

## 2015-04-22 MED ORDER — SODIUM POLYSTYRENE SULFONATE 15 GM/60ML PO SUSP
15.0000 g | Freq: Once | ORAL | Status: AC
  Administered 2015-04-22: 15 g via ORAL
  Filled 2015-04-22: qty 60

## 2015-04-22 MED ORDER — SODIUM CHLORIDE 0.9 % IV SOLN
INTRAVENOUS | Status: DC
  Administered 2015-04-22: 20 mL/h via INTRAVENOUS

## 2015-04-22 MED ORDER — SODIUM CHLORIDE 0.9 % IJ SOLN
3.0000 mL | Freq: Two times a day (BID) | INTRAMUSCULAR | Status: DC
  Administered 2015-04-22: 3 mL via INTRAVENOUS

## 2015-04-22 MED ORDER — LIDOCAINE HCL (PF) 1 % IJ SOLN: 5.0000 mL | INTRAMUSCULAR | Status: DC | PRN

## 2015-04-22 MED ORDER — ONDANSETRON HCL 4 MG PO TABS: 4.0000 mg | ORAL_TABLET | Freq: Four times a day (QID) | ORAL | Status: DC | PRN

## 2015-04-22 MED ORDER — CALCIUM CHLORIDE 10 % IV SOLN
1.0000 g | Freq: Once | INTRAVENOUS | Status: AC
  Administered 2015-04-22: 1 g via INTRAVENOUS
  Filled 2015-04-22: qty 10

## 2015-04-22 MED ORDER — SEVELAMER CARBONATE 800 MG PO TABS
800.0000 mg | ORAL_TABLET | Freq: Three times a day (TID) | ORAL | Status: DC
  Administered 2015-04-22: 800 mg via ORAL
  Filled 2015-04-22: qty 1

## 2015-04-22 MED ORDER — ALTEPLASE 2 MG IJ SOLR
2.0000 mg | Freq: Once | INTRAMUSCULAR | Status: DC | PRN
  Filled 2015-04-22: qty 2

## 2015-04-22 MED ORDER — PENTAFLUOROPROP-TETRAFLUOROETH EX AERO: 1.0000 "application " | INHALATION_SPRAY | CUTANEOUS | Status: DC | PRN

## 2015-04-22 NOTE — H&P (Signed)
Patient Demographics:    Andre Gentry, is a 44 y.o. male  MRN: [id number]   DOB - 1970/08/02  Admit Date - 04/22/2015  Outpatient Primary MD for the patient is No primary care provider on file.   With History of -  Past Medical History  Diagnosis Date  . Hypertension   . Renal failure       Past Surgical History  Procedure Laterality Date  . Dialysis fistula creation Left 2013    LUE   in for   Chief Complaint  Patient presents with  . Vascular Access Problem      HPI:    Andre Gentry  is a 44 y.o. 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 He was sent to the ER. In the ER he is symptom-free however his potassium was found to be 6.9, nephrology was consulted, however sequestered to admit the patient.  He is currently completely symptom free, no headache, no fever chills, no chest pain cough and shortness of breath, no palpitations, no abdominal pain or discomfort, no blood in stool or urine, he is practically anuric, denies any nausea, good appetite, no focal weakness.    Review of systems:    In addition to the HPI above,    No Fever-chills, No Headache, No changes with Vision or hearing, No problems swallowing food or Liquids, No Chest pain, Cough or Shortness of Breath,  No Abdominal pain, No Nausea or Vommitting, Bowel movements are regular, No Blood in stool or Urine, No dysuria, No new skin rashes or bruises, No new joints pains-aches,  No new  weakness, tingling, numbness in any extremity, No recent weight gain or loss,  No polyuria, polydypsia or polyphagia, No significant Mental Stressors.  A full 10 point Review of Systems was done, except as stated above, all other Review of Systems were negative.    Social History:     Social History  Substance Use Topics  . Smoking status: Never Smoker   . Smokeless tobacco: Not on file  . Alcohol Use: No   Lives - at home, mobile and active    Family History :    No H/O ESRD   Home Medications:   Prior to Admission medications   Medication Sig Start Date End Date Taking? Authorizing Provider  sevelamer (RENAGEL) 800 MG tablet Take 24,000 mg by mouth 3 (three) times daily with meals.   Yes Historical Provider, MD     Allergies:    No Known Allergies   Physical Exam:   Vitals  Blood pressure 150/108, pulse 73, resp. rate 14, SpO2  100 %.   1. General young AA male lying in bed in NAD,     2. Normal affect and insight, Not Suicidal or Homicidal, Awake Alert, Oriented X 3.  3. No F.N deficits, ALL C.Nerves Intact, Strength 5/5 all 4 extremities, Sensation intact all 4 extremities, Plantars down going.  4. Ears and Eyes appear Normal, Conjunctivae clear, PERRLA. Moist Oral Mucosa.  5. Supple Neck, No JVD, No cervical lymphadenopathy appriciated, No Carotid Bruits.  6. Symmetrical Chest wall movement, Good air movement bilaterally, CTAB.  7. RRR, No Gallops, Rubs or Murmurs, No Parasternal Heave.  8. Positive Bowel Sounds, Abdomen Soft, No tenderness, No organomegaly appriciated,No rebound -guarding or rigidity.  9.  No Cyanosis, Normal Skin Turgor, No Skin Rash or Bruise.  10. Good muscle tone,  joints appear normal , no effusions, Normal ROM.  11. No Palpable Lymph Nodes in Neck or Axillae      Data Review:    CBC  Recent Labs Lab 04/22/15 1315  WBC 2.5*  HGB 12.2*  HCT 35.9*  PLT 127*  MCV 95.7  MCH 32.5  MCHC 34.0  RDW 13.8    ------------------------------------------------------------------------------------------------------------------  Chemistries   Recent Labs Lab 04/22/15 1315 04/22/15 1415  NA 140  --   K 6.4* 6.9*  CL 100*  --   CO2 25  --   GLUCOSE 81  --   BUN 60*  --   CREATININE 17.05*  --   CALCIUM 8.8*  --    ------------------------------------------------------------------------------------------------------------------ CrCl cannot be calculated (Unknown ideal weight.). ------------------------------------------------------------------------------------------------------------------ No results for input(s): TSH, T4TOTAL, T3FREE, THYROIDAB in the last 72 hours.  Invalid input(s): FREET3   Coagulation profile  Recent Labs Lab 04/22/15 1315  INR 1.17   ------------------------------------------------------------------------------------------------------------------- No results for input(s): DDIMER in the last 72 hours. -------------------------------------------------------------------------------------------------------------------  Cardiac Enzymes No results for input(s): CKMB, TROPONINI, MYOGLOBIN in the last 168 hours.  Invalid input(s): CK ------------------------------------------------------------------------------------------------------------------ Invalid input(s): POCBNP   ---------------------------------------------------------------------------------------------------------------  Urinalysis No results found for: COLORURINE, APPEARANCEUR, LABSPEC, PHURINE, GLUCOSEU, HGBUR, BILIRUBINUR, KETONESUR, PROTEINUR, UROBILINOGEN, NITRITE, LEUKOCYTESUR  ----------------------------------------------------------------------------------------------------------------   Imaging Results:    No results found.  My personal review of EKG: Rhythm NSR,  no Acute ST changes   Assessment & Plan:    1. ESRD with hyperkalemia. Left upper extremity dialysis fistula has  clotted off. Has been seen by renal, hyperkalemia protocol being followed which includes D50, insulin, bicarbonate, Kayexalate, telemetry monitoring. Renal we will arrange for fistula declotting in the morning when he is due for dialysis, he will be placed on renal diet and monitored on telemetry.   2.  Hypertension. We'll place him on hydralazine and monitor.   DVT Prophylaxis Heparin   AM Labs Ordered, also please review Full Orders  Family Communication: Admission, patients condition and plan of care including tests being ordered have been discussed with the patient  who indicates understanding and agree with the plan and Code Status.  Code Status Full  Likely DC to  Home 1-2 days  Condition Fair  Time spent in minutes : 35    SINGH,PRASHANT K M.D on 04/22/2015 at 4:09 PM  Between 7am to 7pm - Pager - [phone]  After 7pm go to [email or web address] - password [email or web address]  [facility] - Office  [phone]

## 2015-04-22 NOTE — ED Notes (Signed)
Admitting at the bedside.  

## 2015-04-22 NOTE — Consult Note (Signed)
Renal Service Consult Note [facility]  Lynnda ShieldsKevin Asay 04/22/2015 Delano MetzSCHERTZ,Alexandera Kuntzman D Requesting Physician: jDr Denton LankSteinl  Reason for Consult:  ESRD patient w clotted AV graft left arm HPI: The patient is a 44 y.o. year-old with hx of HTN and ESRd, on HD since 2012, using LUA for " a couple of years." Presenting with malfunction of AVG, specificially the graft is clotted. It was clotted and repaired by IR twice recently , with a simple declot in mid-Sept, another LUA AVG declot Oct 24th and another AVG declot on Nov 11th.  He says it was working fine for HD on Sat, but when he got home it was clotted.  It hurt for a little while.  NO complaints today.  Pt is in ED and K 6.8, no symptoms.  No EKG changes.    ROS  no SOB, CP  no abd pain, n/v/d  no fever, HA  no rash  Past Medical History  Past Medical History  Diagnosis Date  . Hypertension   . Renal failure    Past Surgical History  Past Surgical History  Procedure Laterality Date  . Dialysis fistula creation Left 2013    LUE   Family History History reviewed. No pertinent family history. Social History  reports that he has never smoked. He does not have any smokeless tobacco history on file. He reports that he does not drink alcohol. His drug history is not on file. Allergies No Known Allergies Home medications Prior to Admission medications   Medication Sig Start Date End Date Taking? Authorizing Provider  sevelamer (RENAGEL) 800 MG tablet Take 24,000 mg by mouth 3 (three) times daily with meals.   Yes Historical Provider, MD   Liver Function Tests No results for input(s): AST, ALT, ALKPHOS, BILITOT, PROT, ALBUMIN in the last 168 hours. No results for input(s): LIPASE, AMYLASE in the last 168 hours. CBC  Recent Labs Lab 04/22/15 1315  WBC 2.5*  HGB 12.2*  HCT 35.9*  MCV 95.7  PLT 127*   Basic Metabolic Panel  Recent Labs Lab 04/22/15 1315 04/22/15 1415  NA 140  --   K 6.4* 6.9*  CL 100*  --   CO2  25  --   GLUCOSE 81  --   BUN 60*  --   CREATININE 17.05*  --   CALCIUM 8.8*  --     Filed Vitals:   04/22/15 1400 04/22/15 1419 04/22/15 1430 04/22/15 1500  BP: 136/107 136/97 131/100 146/99  Pulse: 71 72 67 70  Resp:  12 11 12   SpO2: 100% 100% 99% 100%   Exam Alert, no distress, calm No rash, cyanosis or gangrene Sclera anicteric, throat clear No jvd Chest clear bilat RRR no mrg Abd soft ntnd +bs no mass MS no joint effusions Ext no LE or UE edema Neuro alert ox 3 nonfocal  TTS NGKC   4h 15min   77kg   450/800   2/2.25 bath  Heparin 5000   LUA AVG Calcitriol 2.5 ug tiw Darbe 25 ug / wk Last pth 552, Ca/P 9.5/7.1  Assessment: 1 Clotted AVG 2 Hyperkalemia , no ekg changes 3 ESRD  4 HTN 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  Therefore must be admitted. This is a holiday so IR not open today. Plan admit to medical service/tele bed, give kayexalate tonight, low K diet.  IR consult for declot of AVG ordered.  HD when access established.   Vinson Moselle MD [employer] pager [phone]    cell [phone] 04/22/2015, 3:24 PM

## 2015-04-22 NOTE — ED Notes (Signed)
Pt's bed assignment switched. Attempted to call report x1 on [address]. Unable to take it at this time

## 2015-04-22 NOTE — ED Notes (Addendum)
Pt admits to having dialysis on Saturday, was experiencing left arm pain around his fistula - pt states he no longer feels a thrill in his fistula today. No thrill or bruit noted on exam, CMS intact distal to fistula. Pt denies arm pain at present.

## 2015-04-22 NOTE — ED Provider Notes (Addendum)
CSN: [id number]     Arrival date & time 04/22/15  1148 History   First MD Initiated Contact with Patient 04/22/15 1237     Chief Complaint  Patient presents with  . Vascular Access Problem     (Consider location/radiation/quality/duration/timing/severity/associated sxs/prior Treatment) The history is provided by the patient.  Patient w hx esrd on hd T/TH/Sa, c/o pain around fistula and not feeling thrill since his last dialysis 2 days ago. States had normal hd then [facility]([address]   Hx similar problems/symptoms in past when fistula clotted. No numbness, weakness, or loss of normal function of hand or arm.  No fever or chills. No chest pain or sob. No weakness.      Past Medical History  Diagnosis Date  . Hypertension   . Renal failure    Past Surgical History  Procedure Laterality Date  . Dialysis fistula creation Left 2013    LUE   History reviewed. No pertinent family history. Social History  Substance Use Topics  . Smoking status: Never Smoker   . Smokeless tobacco: None  . Alcohol Use: No    Review of Systems  Constitutional: Negative for fever.  HENT: Negative for sore throat.   Eyes: Negative for redness.  Respiratory: Negative for shortness of breath.   Cardiovascular: Negative for chest pain.  Gastrointestinal: Negative for abdominal pain.  Genitourinary: Negative for flank pain.  Musculoskeletal: Negative for back pain and neck pain.  Skin: Negative for rash.  Neurological: Negative for headaches.  Hematological: Does not bruise/bleed easily.  Psychiatric/Behavioral: Negative for confusion.      Allergies  Review of patient's allergies indicates no known allergies.  Home Medications   Prior to Admission medications   Medication Sig Start Date End Date Taking? Authorizing Provider  sevelamer (RENAGEL) 800 MG tablet Take 24,000 mg by mouth 3 (three) times daily with meals.    Historical Provider, MD   BP 126/99 mmHg  Pulse 73  Resp 18  SpO2  97% Physical Exam  Constitutional: He is oriented to person, place, and time. He appears well-developed and well-nourished. No distress.  HENT:  Head: Atraumatic.  Eyes: Conjunctivae are normal.  Neck: Neck supple. No tracheal deviation present.  Cardiovascular: Normal rate.   Pulmonary/Chest: Effort normal. No accessory muscle usage. No respiratory distress.  Abdominal: He exhibits no distension.  Musculoskeletal: Normal range of motion.  Left upper arm dialyses graft with palp thrill or audible bruit. Radial pulse palp.   Neurological: He is alert and oriented to person, place, and time.  Skin: Skin is warm and dry. He is not diaphoretic.  Psychiatric: He has a normal mood and affect.  Nursing note and vitals reviewed.   ED Course  Procedures (including critical care time) Labs Review  Results for orders placed or performed during the hospital encounter of 04/22/15  CBC  Result Value Ref Range   WBC 2.5 (L) 4.0 - 10.5 K/uL   RBC 3.75 (L) 4.22 - 5.81 MIL/uL   Hemoglobin 12.2 (L) 13.0 - 17.0 g/dL   HCT 40.935.9 (L) 81.139.0 - 91.452.0 %   MCV 95.7 78.0 - 100.0 fL   MCH 32.5 26.0 - 34.0 pg   MCHC 34.0 30.0 - 36.0 g/dL   RDW 78.213.8 95.611.5 - 21.315.5 %   Platelets 127 (L) 150 - 400 K/uL  Basic metabolic panel  Result Value Ref Range   Sodium 140 135 - 145 mmol/L   Potassium 6.4 (HH) 3.5 - 5.1 mmol/L   Chloride 100 (L)  101 - 111 mmol/L   CO2 25 22 - 32 mmol/L   Glucose, Bld 81 65 - 99 mg/dL   BUN 60 (H) 6 - 20 mg/dL   Creatinine, Ser 40.98 (H) 0.61 - 1.24 mg/dL   Calcium 8.8 (L) 8.9 - 10.3 mg/dL   GFR calc non Af Amer 3 (L) >60 mL/min   GFR calc Af Amer 3 (L) >60 mL/min   Anion gap 15 5 - 15  Protime-INR  Result Value Ref Range   Prothrombin Time 15.1 11.6 - 15.2 seconds   INR 1.17 0.00 - 1.49  Potassium  Result Value Ref Range   Potassium 6.9 (HH) 3.5 - 5.1 mmol/L        I have personally reviewed and evaluated these lab results as part of my medical decision-making.    MDM    Labs.  Patients kidney doctor consulted.   Reviewed nursing notes and prior charts for additional history.   On review of prior charts, hx recurrent fistula thrombosis, with IR thrombolysis - will discuss plan w renal.   k high, monitor/ecg.  Redraw for repeat. Cacl iv.  Renal paged.    Discussed pt with Dr Arlean Hopping, including clotted hd graft, due for hd tomorrow, and k high today - he indicates he will re-contact me with plan.   As do not anticipate v rapid hd due to clotted fistula, will give additional meds for high k.  d50 iv, hco3 iv, insulin iv, kayexalate po.   Dr Arlean Hopping indicated to admit to med service, he will arrange IR to do thrombectomy/thrombolysis in AM tomorrow, and do HD after that.   Discussed pt with Dr Burney Gauze, who requests temp orders, tele bed.       Cathren Laine, MD 04/22/15 [phone]

## 2015-04-23 ENCOUNTER — Inpatient Hospital Stay (HOSPITAL_COMMUNITY): Payer: Medicaid Other

## 2015-04-23 LAB — BASIC METABOLIC PANEL
ANION GAP: 17 — AB (ref 5–15)
BUN: 71 mg/dL — ABNORMAL HIGH (ref 6–20)
CALCIUM: 8.5 mg/dL — AB (ref 8.9–10.3)
CO2: 24 mmol/L (ref 22–32)
Chloride: 100 mmol/L — ABNORMAL LOW (ref 101–111)
Creatinine, Ser: 18.5 mg/dL — ABNORMAL HIGH (ref 0.61–1.24)
GFR, EST AFRICAN AMERICAN: 3 mL/min — AB (ref >60)
GFR, EST NON AFRICAN AMERICAN: 3 mL/min — AB (ref >60)
Glucose, Bld: 87 mg/dL (ref 65–99)
POTASSIUM: 5.5 mmol/L — AB (ref 3.5–5.1)
SODIUM: 141 mmol/L (ref 135–145)

## 2015-04-23 LAB — CBC
HCT: 34.2 % — ABNORMAL LOW (ref 39.0–52.0)
HEMOGLOBIN: 11.9 g/dL — AB (ref 13.0–17.0)
MCH: 32.8 pg (ref 26.0–34.0)
MCHC: 34.8 g/dL (ref 30.0–36.0)
MCV: 94.2 fL (ref 78.0–100.0)
Platelets: 121 10*3/uL — ABNORMAL LOW (ref 150–400)
RBC: 3.63 MIL/uL — AB (ref 4.22–5.81)
RDW: 13.8 % (ref 11.5–15.5)
WBC: 2.5 10*3/uL — AB (ref 4.0–10.5)

## 2015-04-23 MED ORDER — IOHEXOL 300 MG/ML  SOLN
100.0000 mL | Freq: Once | INTRAMUSCULAR | Status: AC | PRN
  Administered 2015-04-23: 50 mL via INTRAVENOUS

## 2015-04-23 MED ORDER — HEPARIN SODIUM (PORCINE) 1000 UNIT/ML IJ SOLN
INTRAMUSCULAR | Status: AC
  Filled 2015-04-23: qty 1

## 2015-04-23 MED ORDER — ALTEPLASE 100 MG IV SOLR
4.0000 mg | Freq: Once | INTRAVENOUS | Status: DC
  Filled 2015-04-23: qty 4

## 2015-04-23 MED ORDER — ALTEPLASE 100 MG IV SOLR
INTRAVENOUS | Status: AC | PRN
  Administered 2015-04-23: 4 mg

## 2015-04-23 MED ORDER — HEPARIN SODIUM (PORCINE) 1000 UNIT/ML IJ SOLN
INTRAMUSCULAR | Status: AC | PRN
  Administered 2015-04-23: 4000 [IU] via INTRAVENOUS

## 2015-04-23 MED ORDER — MIDAZOLAM HCL 2 MG/2ML IJ SOLN
INTRAMUSCULAR | Status: AC
  Filled 2015-04-23: qty 4

## 2015-04-23 MED ORDER — LIDOCAINE HCL 1 % IJ SOLN
INTRAMUSCULAR | Status: AC
  Filled 2015-04-23: qty 20

## 2015-04-23 MED ORDER — SODIUM POLYSTYRENE SULFONATE PO POWD: ORAL | Status: AC

## 2015-04-23 MED ORDER — FENTANYL CITRATE (PF) 100 MCG/2ML IJ SOLN
INTRAMUSCULAR | Status: AC
  Filled 2015-04-23: qty 4

## 2015-04-23 MED ORDER — FENTANYL CITRATE (PF) 100 MCG/2ML IJ SOLN
INTRAMUSCULAR | Status: AC | PRN
  Administered 2015-04-23: 100 ug via INTRAVENOUS
  Administered 2015-04-23 (×2): 50 ug via INTRAVENOUS

## 2015-04-23 MED ORDER — MIDAZOLAM HCL 2 MG/2ML IJ SOLN
INTRAMUSCULAR | Status: AC | PRN
  Administered 2015-04-23: 1 mg via INTRAVENOUS
  Administered 2015-04-23: 2 mg via INTRAVENOUS
  Administered 2015-04-23: 1 mg via INTRAVENOUS

## 2015-04-23 NOTE — Progress Notes (Addendum)
  [facility] Progress Note   Subjective: alert, just back from IR w patent AVG s/p declot and stenting  Filed Vitals:   04/23/15 1247 04/23/15 1253 04/23/15 1258 04/23/15 1303  BP: 131/82 112/82 118/90 136/90  Pulse: 84 80 78 86  Temp:      TempSrc:      Resp: 12 9 10 12   Height:      Weight:      SpO2: 100% 100% 100% 100%    Inpatient medications: . alteplase  4 mg Intracatheter Once  . fentaNYL      . heparin      . heparin  5,000 Units Subcutaneous 3 times per day  . hydrALAZINE  50 mg Oral 3 times per day  . lidocaine      . midazolam      . sevelamer carbonate  800 mg Oral TID WC  . sodium chloride  3 mL Intravenous Q12H   . sodium chloride 20 mL/hr (04/22/15 1505)   sodium chloride, sodium chloride, alteplase, alum & mag hydroxide-simeth, guaiFENesin-dextromethorphan, heparin, hydrALAZINE, HYDROcodone-acetaminophen, lidocaine (PF), lidocaine-prilocaine, ondansetron **OR** ondansetron (ZOFRAN) IV, pentafluoroprop-tetrafluoroeth  Exam: Alert, no distress, calm No rash, cyanosis or gangrene Sclera anicteric, throat clear No jvd Chest clear bilat RRR no mrg Abd soft ntnd +bs no mass MS no joint effusions Ext no LE or UE edema Neuro alert ox 3 nonfocal  TTS [facility]KC 4h 15min 77kg 450/800 2/2.25 bath Heparin 5000 LUA AVG Calcitriol 2.5 ug tiw Darbe 25 ug / wk Last pth 552, Ca/P 9.5/7.1  Assessment: 1 Clotted AVG, sp declot/ 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 Patient doesn't want to do inpt HD today, says "I can make it til Thursday doc".  Discussed K and vol restrictions with patient.    Vinson Moselleob Emerson Schreifels MD [facility] pager [phone]    cell [phone] 04/23/2015, 1:41 PM    Recent Labs Lab 04/22/15 1315 04/22/15 1415 04/23/15 0745  NA 140  --  141  K 6.4* 6.9* 5.5*  CL 100*  --  100*  CO2 25  --  24   GLUCOSE 81  --  87  BUN 60*  --  71*  CREATININE 17.05*  --  18.50*  CALCIUM 8.8*  --  8.5*   No results for input(s): AST, ALT, ALKPHOS, BILITOT, PROT, ALBUMIN in the last 168 hours.  Recent Labs Lab 04/22/15 1315 04/23/15 0745  WBC 2.5* 2.5*  HGB 12.2* 11.9*  HCT 35.9* 34.2*  MCV 95.7 94.2  PLT 127* 121*

## 2015-04-23 NOTE — Discharge Instructions (Signed)
Follow with Primary MD  in 3 days   Get CBC, CMP, 2 view Chest X ray checked  by Primary MD next visit.    Activity: As tolerated with Full fall precautions use walker/cane & assistance as needed   Disposition Home    Diet:   Renal Check your Weight same time everyday, if you gain over 2 pounds, or you develop in leg swelling, experience more shortness of breath or chest pain, call your Primary MD immediately. Follow Cardiac Low Salt Diet and 1.2 lit/day fluid restriction.   On your next visit with your primary care physician please Get Medicines reviewed and adjusted.   Please request your Prim.MD to go over all Hospital Tests and Procedure/Radiological results at the follow up, please get all Hospital records sent to your Prim MD by signing hospital release before you go home.   If you experience worsening of your admission symptoms, develop shortness of breath, life threatening emergency, suicidal or homicidal thoughts you must seek medical attention immediately by calling 911 or calling your MD immediately  if symptoms less severe.  You Must read complete instructions/literature along with all the possible adverse reactions/side effects for all the Medicines you take and that have been prescribed to you. Take any new Medicines after you have completely understood and accpet all the possible adverse reactions/side effects.   Do not drive, operating heavy machinery, perform activities at heights, swimming or participation in water activities or provide baby sitting services if your were admitted for syncope or siezures until you have seen by Primary MD or a Neurologist and advised to do so again.  Do not drive when taking Pain medications.    Do not take more than prescribed Pain, Sleep and Anxiety Medications  Special Instructions: If you have smoked or chewed Tobacco  in the last 2 yrs please stop smoking, stop any regular Alcohol  and or any Recreational drug use.  Wear Seat  belts while driving.   Please note  You were cared for by a hospitalist during your hospital stay. If you have any questions about your discharge medications or the care you received while you were in the hospital after you are discharged, you can call the unit and asked to speak with the hospitalist on call if the hospitalist that took care of you is not available. Once you are discharged, your primary care physician will handle any further medical issues. Please note that NO REFILLS for any discharge medications will be authorized once you are discharged, as it is imperative that you return to your primary care physician (or establish a relationship with a primary care physician if you do not have one) for your aftercare needs so that they can reassess your need for medications and monitor your lab values.

## 2015-04-23 NOTE — Procedures (Signed)
Technically successful declot of left upper arm dialysis graft necessitating placement of an uncovered stent at the venous anastomosis secondary to recurrent/refractory stenosis and subsequent graft occlusions.  No immediate post procedural complications.  Katherina RightJay Elorah Dewing, MD Pager #: [phone]

## 2015-04-23 NOTE — Consult Note (Signed)
Chief Complaint: Patient was seen in consultation today for left arm dialysis graft declot Chief Complaint  Patient presents with  . Vascular Access Problem   at the request of Dr Thedore Mins  Referring Physician(s): Dr Arlean Hopping  History of Present Illness: Andre Gentry is Gentry 44 y.o. male   ESRD Pt known to IR Left arm dialysis graft thrombolysis 12/2014; 01/2015; 03/08/2015 Angioplasty 04/2014 03/08/2015: IMPRESSION: Successful left upper arm AV graft thrombectomy, thrombo lysis and venous angioplasty to restore flow. Access ready for use.  Access management: If the access re- occludes prior to 06/08/2015, consider fluency stent placement of the venous anastomosis into the venous limb of the graft.  Pt states was running well even 04/20/15 Noted no thrill 04/21/15 Admitted 12/26 Request made for Left arm dialysis graft thrombolysis/thrombectomy with possible angioplasty/stent placement Possible dialysis catheter placement if needed I have seen and examined pt   Past Medical History  Diagnosis Date  . Hypertension   . ESRD (end stage renal disease) on dialysis Andre Gentry)     on HD since 2012/notes 04/22/2015 "Andre Gentry" (04/22/2015)    Past Surgical History  Procedure Laterality Date  . Dialysis fistula creation Left 2013    LUE  . Sp declot avgg  mid September, 2016; 02/18/2015; 03/08/2015    Andre Gentry 04/22/2015  . Shuntogram Left 04/2014    AVG/notes 05/23/2014    Allergies: Review of patient's allergies indicates no known allergies.  Medications: Prior to Admission medications   Medication Sig Start Date End Date Taking? Authorizing Provider  sevelamer (RENAGEL) 800 MG tablet Take 24,000 mg by mouth 3 (three) times daily with meals.   Yes Historical Provider, MD     History reviewed. No pertinent family history.  Social History   Social History  . Marital Status: Single    Spouse Name: N/Gentry  . Number of Children: N/Gentry  . Years of  Education: N/Gentry   Social History Main Topics  . Smoking status: Never Smoker   . Smokeless tobacco: Never Used  . Alcohol Use: No  . Drug Use: No  . Sexual Activity: Not Asked   Other Topics Concern  . None   Social History Narrative     Review of Systems: Gentry 12 point ROS discussed and pertinent positives are indicated in the HPI above.  All other systems are negative.  Review of Systems  Constitutional: Negative for fever, activity change, appetite change and fatigue.  Respiratory: Negative for shortness of breath.   Gastrointestinal: Negative for abdominal pain.  Neurological: Negative for weakness.  Psychiatric/Behavioral: Negative for behavioral problems and confusion.    Vital Signs: BP 139/99 mmHg  Pulse 79  Temp(Src) 98.5 F (36.9 C) (Oral)  Resp 16  Ht  (1.803 m)  Wt 179 lb 1.6 oz (81.239 kg)  BMI 24.99 kg/m2  SpO2 100%  Physical Exam  Constitutional: He is oriented to person, place, and time.  Cardiovascular: Normal rate, regular rhythm and normal heart sounds.   Pulmonary/Chest: Effort normal and breath sounds normal. He has no wheezes.  Abdominal: Soft. Bowel sounds are normal.  Musculoskeletal: Normal range of motion.  L upper arm dialysis graft No thrill; No pulse  Neurological: He is alert and oriented to person, place, and time.  Skin: Skin is Andre and dry.  Psychiatric: He has Gentry normal mood and affect. His behavior is normal. Judgment and thought content normal.  Nursing note and vitals reviewed.   Mallampati Score:  MD Evaluation Airway:  WNL Heart: WNL Abdomen: WNL Chest/ Lungs: WNL ASA  Classification: 3 Mallampati/Airway Score: One  Imaging: No results found.  Labs:  CBC:  Recent Labs  01/08/15 1115 02/18/15 1357 04/22/15 1315 04/23/15 0745  WBC 2.2* 2.2* 2.5* 2.5*  HGB 10.4* 10.9* 12.2* 11.9*  HCT 31.4* 32.4* 35.9* 34.2*  PLT 127* 137* 127* 121*    COAGS:  Recent Labs  01/08/15 1115 02/18/15 1357  04/22/15 1315  INR 1.24 1.11 1.17  APTT 46* 28  --     BMP:  Recent Labs  01/08/15 1115 02/18/15 1357 04/22/15 1315 04/22/15 1415 04/23/15 0745  NA 140 141 140  --  141  K 6.7* 5.1 6.4* 6.9* 5.5*  CL 105 101 100*  --  100*  CO2 14* 27 25  --  24  GLUCOSE 79 77 81  --  87  BUN 108* 41* 60*  --  71*  CALCIUM 7.1* 9.2 8.8*  --  8.5*  CREATININE 29.57* 15.43* 17.05*  --  18.50*  GFRNONAA 1* 3* 3*  --  3*  GFRAA 2* 4* 3*  --  3*    LIVER FUNCTION TESTS: No results for input(s): BILITOT, AST, ALT, ALKPHOS, PROT, ALBUMIN in the last 8760 hours.  TUMOR MARKERS: No results for input(s): AFPTM, CEA, CA199, CHROMGRNA in the last 8760 hours.  Assessment and Plan:  Left upper arm dialysis graft clotted Scheduled now for thrombolysis/thrombectomy with poss pta/stent Possible dialysis catheter placement Risks and Benefits discussed with the patient including, but not limited to bleeding, infection, vascular injury, pulmonary embolism, need for tunneled HD catheter placement or even death. All of the patient's questions were answered, patient is agreeable to proceed. Consent signed and in chart.   Thank you for this interesting consult.  I greatly enjoyed meeting Andre Gentry and look forward to participating in their care.  Gentry copy of this report was sent to the requesting provider on this date.  Signed: Katheline Brendlinger Gentry 04/23/2015, 10:03 AM   I spent Gentry total of 20 Minutes    in face to face in clinical consultation, greater than 50% of which was counseling/coordinating care for L arm dialysis graft declot

## 2015-04-23 NOTE — Progress Notes (Signed)
Patient Demographics:    Besnik Febus, is a 44 y.o. male, DOB - 08-19-1970, ZOX:[id number]  Admit date - 04/22/2015   Admitting Physician Leroy Sea, MD  Outpatient Primary MD for the patient is No primary care provider on file.  LOS - 1   Chief Complaint  Patient presents with  . Vascular Access Problem        Subjective:    Isadore Palecek today has, No headache, No chest pain, No abdominal pain - No Nausea, No new weakness tingling or numbness, No Cough - SOB.    Assessment  & Plan :    1. ESRD with hyperkalemia. Left upper extremity dialysis fistula has clotted off. Has been seen by renal, hyperkalemia was followed and K has improved, IR to de clot the fistula, then per renal, on Renal diet, T Th,Sat dialysis..   2. Hypertension. On hydralazine PO scheduled and IV PRN monitor.   Code Status : Full  Family Communication  : None  Disposition Plan  : Home 1-2 days  Consults  :  renal  Procedures  :  None  DVT Prophylaxis  :    Heparin    Lab Results  Component Value Date   PLT 121* 04/23/2015    Inpatient Medications  Scheduled Meds: . alteplase  4 mg Intracatheter Once  . heparin  5,000 Units Subcutaneous 3 times per day  . hydrALAZINE  50 mg Oral 3 times per day  . sevelamer carbonate  800 mg Oral TID WC  . sodium chloride  3 mL Intravenous Q12H   Continuous Infusions: . sodium chloride 20 mL/hr (04/22/15 1505)   PRN Meds:.sodium chloride, sodium chloride, alteplase, alum & mag hydroxide-simeth, guaiFENesin-dextromethorphan, heparin, hydrALAZINE, HYDROcodone-acetaminophen, lidocaine (PF), lidocaine-prilocaine, ondansetron **OR** ondansetron (ZOFRAN) IV, pentafluoroprop-tetrafluoroeth  Antibiotics  :     Anti-infectives    None        Objective:   Filed  Vitals:   04/22/15 2006 04/22/15 2100 04/23/15 0500 04/23/15 0855  BP: 140/97  138/105 139/99  Pulse: 80  70 79  Temp: 97.5 F (36.4 C)  98 F (36.7 C) 98.5 F (36.9 C)  TempSrc: Oral  Oral Oral  Resp: Height:   (1.803 m)    Weight: 81.239 kg (179 lb 1.6 oz)     SpO2: 100%  100% 100%    Wt Readings from Last 3 Encounters:  04/22/15 81.239 kg (179 lb 1.6 oz)  02/18/15 79.379 kg (175 lb)  01/08/15 79.9 kg (176 lb 2.4 oz)     Intake/Output Summary (Last 24 hours) at 04/23/15 0954 Last data filed at 04/23/15 0856  Gross per 24 hour  Intake    240 ml  Output      0 ml  Net    240 ml     Physical Exam  Awake Alert, Oriented X 3, No new F.N deficits, Normal affect [place].AT,PERRAL Supple Neck,No JVD, No cervical lymphadenopathy appriciated.  Symmetrical Chest wall movement, Good air movement bilaterally, CTAB RRR,No Gallops,Rubs or new Murmurs, No Parasternal Heave +ve B.Sounds, Abd Soft, No tenderness, No organomegaly appriciated, No rebound - guarding or rigidity. No Cyanosis, Clubbing or edema, No new Rash or bruise  Data Review:   Micro Results Recent Results (from the past 240 hour(s))  MRSA PCR Screening     Status: None   Collection Time: 04/22/15  9:43 PM  Result Value Ref Range Status   MRSA by PCR NEGATIVE NEGATIVE Final    Comment:        The GeneXpert MRSA Assay (FDA approved for NASAL specimens only), is one component of a comprehensive MRSA colonization surveillance program. It is not intended to diagnose MRSA infection nor to guide or monitor treatment for MRSA infections.     Radiology Reports No results found.   CBC  Recent Labs Lab 04/22/15 1315 04/23/15 0745  WBC 2.5* 2.5*  HGB 12.2* 11.9*  HCT 35.9* 34.2*  PLT 127* 121*  MCV 95.7 94.2  MCH 32.5 32.8  MCHC 34.0 34.8  RDW 13.8 13.8    Chemistries   Recent Labs Lab 04/22/15 1315 04/22/15 1415 04/23/15 0745  NA 140  --  141  K 6.4* 6.9* 5.5*  CL 100*   --  100*  CO2 25  --  24  GLUCOSE 81  --  87  BUN 60*  --  71*  CREATININE 17.05*  --  18.50*  CALCIUM 8.8*  --  8.5*   ------------------------------------------------------------------------------------------------------------------ estimated creatinine clearance is 5.4 mL/min (by C-G formula based on Cr of 18.5). ------------------------------------------------------------------------------------------------------------------ No results for input(s): HGBA1C in the last 72 hours. ------------------------------------------------------------------------------------------------------------------ No results for input(s): CHOL, HDL, LDLCALC, TRIG, CHOLHDL, LDLDIRECT in the last 72 hours. ------------------------------------------------------------------------------------------------------------------ No results for input(s): TSH, T4TOTAL, T3FREE, THYROIDAB in the last 72 hours.  Invalid input(s): FREET3 ------------------------------------------------------------------------------------------------------------------ No results for input(s): VITAMINB12, FOLATE, FERRITIN, TIBC, IRON, RETICCTPCT in the last 72 hours.  Coagulation profile  Recent Labs Lab 04/22/15 1315  INR 1.17    No results for input(s): DDIMER in the last 72 hours.  Cardiac Enzymes No results for input(s): CKMB, TROPONINI, MYOGLOBIN in the last 168 hours.  Invalid input(s): CK ------------------------------------------------------------------------------------------------------------------ Invalid input(s): POCBNP   Time Spent in minutes   30   Susa RaringSINGH,Meeyah Ovitt K M.D on 04/23/2015 at 9:54 AM  Between 7am to 7pm - Pager - [phone]  After 7pm go to [email or web address] - password [email or web address]  [facility] -  Office  [phone]

## 2015-04-23 NOTE — Progress Notes (Signed)
Pt ready to go home after IR procedure to open L upper AVG. Told nephrologist he can wait until Thursday for HD. Discharged home with script for one dose of kaexalate. Education provided. Discussed calling dialysis center for possible appointment tommorrowl. Patient agreed. NSL discontinued with catheter intact. Pt escorted out on foot with this RN escorr per pt choice. Bess KindsGWALTNEY, Argyle Gustafson B, RN

## 2015-04-23 NOTE — Discharge Summary (Signed)
Andre Gentry, is a 44 y.o. male  DOB [birth year]  MRN [id number].  Admission date:  04/22/2015  Admitting Physician  Andre Sea, MD  Discharge Date:  04/23/2015   Primary MD  No primary care provider on file.  Recommendations for primary care physician for things to follow:   Outpatient dialysis per schedule, monitor potassium levels closely.   Admission Diagnosis  Clotted dialysis access [facility]) [T82.49XA]   Discharge Diagnosis  Clotted dialysis access [facility]) [T82.49XA]     Principal Problem:   Hyperkalemia Active Problems:   ESRD (end stage renal disease) (HCC)   AV graft thrombosis (HCC)   Clotted dialysis access [facility])      Past Medical History  Diagnosis Date  . Hypertension   . ESRD (end stage renal disease) on dialysis [facility])     on HD since 2012/notes 04/22/2015 "[facility]; [address]; TTS" (04/22/2015)    Past Surgical History  Procedure Laterality Date  . Dialysis fistula creation Left 2013    LUE  . Sp declot avgg  mid September, 2016; 02/18/2015; 03/08/2015    Hattie Perch 04/22/2015  . Shuntogram Left 04/2014    AVG/notes 05/23/2014       HPI  from the history and physical done on the day of admission:   Andre Gentry is a 44 y.o. male, with history of hypertension induced ESRD, now on dialysis, on Tuesday Thursday Saturday schedule, he comes to the hospital after his last dialysis on Saturday when he noticed postdialysis that his left upper extremity sensation had changed, he has a fistula in his left arm, he subsequently contacted his nephrologist and he was found to have clotted left upper extremity fistula. He was sent to the ER. In the ER he is symptom-free however his potassium was found to be 6.9, nephrology was consulted, however sequestered to admit the patient.  He is  currently completely symptom free, no headache, no fever chills, no chest pain cough and shortness of breath, no palpitations, no abdominal pain or discomfort, no blood in stool or urine, he is practically anuric, denies any nausea, good appetite, no focal weakness.     Hospital Course:     1. ESRD with hyperkalemia. Left upper extremity dialysis fistula has clotted off. Has been seen by renal, hyperkalemia protocol being followed which includes D50, insulin, bicarbonate, Kayexalate, potassium level stable this morning, seen by nephrology and IR, his left arm fistula was declotted, he refused dialysis today and wants to be discharged. Seen by renal eye was called by nephrology attending Dr. Vida Roller that patient should be discharged with 1 dose of Kayexalate 30 g to be given tomorrow, he wants to have his dialysis done in the outpatient setting on Thursday.   2. Hypertension. Table off medications monitored in the outpatient setting.      Discharge Condition: Fair  Follow UP  Follow-up Information    Follow up with PCP and Dialysis Center. Schedule an appointment as soon as possible for a visit in 1 day.  Consults obtained - Renal, IR  Diet and Activity recommendation: See Discharge Instructions below  Discharge Instructions       Discharge Instructions    Discharge instructions    Complete by:  As directed   Follow with Primary MD  in 3 days   Get CBC, CMP, 2 view Chest X ray checked  by Primary MD next visit.    Activity: As tolerated with Full fall precautions use walker/cane & assistance as needed   Disposition Home    Diet:   Renal Check your Weight same time everyday, if you gain over 2 pounds, or you develop in leg swelling, experience more shortness of breath or chest pain, call your Primary MD immediately. Follow Cardiac Low Salt Diet and 1.2 lit/day fluid restriction.   On your next visit with your primary care physician please Get Medicines reviewed and  adjusted.   Please request your Prim.MD to go over all Hospital Tests and Procedure/Radiological results at the follow up, please get all Hospital records sent to your Prim MD by signing hospital release before you go home.   If you experience worsening of your admission symptoms, develop shortness of breath, life threatening emergency, suicidal or homicidal thoughts you must seek medical attention immediately by calling 911 or calling your MD immediately  if symptoms less severe.  You Must read complete instructions/literature along with all the possible adverse reactions/side effects for all the Medicines you take and that have been prescribed to you. Take any new Medicines after you have completely understood and accpet all the possible adverse reactions/side effects.   Do not drive, operating heavy machinery, perform activities at heights, swimming or participation in water activities or provide baby sitting services if your were admitted for syncope or siezures until you have seen by Primary MD or a Neurologist and advised to do so again.  Do not drive when taking Pain medications.    Do not take more than prescribed Pain, Sleep and Anxiety Medications  Special Instructions: If you have smoked or chewed Tobacco  in the last 2 yrs please stop smoking, stop any regular Alcohol  and or any Recreational drug use.  Wear Seat belts while driving.   Please note  You were cared for by a hospitalist during your hospital stay. If you have any questions about your discharge medications or the care you received while you were in the hospital after you are discharged, you can call the unit and asked to speak with the hospitalist on call if the hospitalist that took care of you is not available. Once you are discharged, your primary care physician will handle any further medical issues. Please note that NO REFILLS for any discharge medications will be authorized once you are discharged, as it is  imperative that you return to your primary care physician (or establish a relationship with a primary care physician if you do not have one) for your aftercare needs so that they can reassess your need for medications and monitor your lab values.     Increase activity slowly    Complete by:  As directed              Discharge Medications       Medication List    TAKE these medications        sevelamer 800 MG tablet  Commonly known as:  RENAGEL  Take 24,000 mg by mouth 3 (three) times daily with meals.     sodium polystyrene powder  Commonly  known as:  KAYEXALATE  Take 30gms by mouth on 04-24-15        Major procedures and Radiology Reports - PLEASE review detailed and final reports for all details, in brief -       No results found.  Micro Results      Recent Results (from the past 240 hour(s))  MRSA PCR Screening     Status: None   Collection Time: 04/22/15  9:43 PM  Result Value Ref Range Status   MRSA by PCR NEGATIVE NEGATIVE Final    Comment:        The GeneXpert MRSA Assay (FDA approved for NASAL specimens only), is one component of a comprehensive MRSA colonization surveillance program. It is not intended to diagnose MRSA infection nor to guide or monitor treatment for MRSA infections.        Today   Subjective    Andre Gentry today has no headache,no chest abdominal pain,no new weakness tingling or numbness, feels much better wants to go home today.     Objective   Blood pressure 136/90, pulse 86, temperature 98.5 F (36.9 C), temperature source Oral, resp. rate 12, height 5\' 11"  (1.803 m), weight 80.4 kg (177 lb 4 oz), SpO2 100 %.   Intake/Output Summary (Last 24 hours) at 04/23/15 1402 Last data filed at 04/23/15 0856  Gross per 24 hour  Intake    240 ml  Output      0 ml  Net    240 ml    Exam Awake Alert, Oriented x 3, No new F.N deficits, Normal affect Perry.AT,PERRAL Supple Neck,No JVD, No cervical lymphadenopathy  appriciated.  Symmetrical Chest wall movement, Good air movement bilaterally, CTAB RRR,No Gallops,Rubs or new Murmurs, No Parasternal Heave +ve B.Sounds, Abd Soft, Non tender, No organomegaly appriciated, No rebound -guarding or rigidity. No Cyanosis, Clubbing or edema, No new Rash or bruise   Data Review   CBC w Diff: Lab Results  Component Value Date   WBC 2.5* 04/23/2015   HGB 11.9* 04/23/2015   HCT 34.2* 04/23/2015   PLT 121* 04/23/2015    CMP: Lab Results  Component Value Date   NA 141 04/23/2015   K 5.5* 04/23/2015   CL 100* 04/23/2015   CO2 24 04/23/2015   BUN 71* 04/23/2015   CREATININE 18.50* 04/23/2015  .   Total Time in preparing paper work, data evaluation and todays exam - 35 minutes  Andre Gentry,Diamond Jentz K M.D on 04/23/2015 at 2:02 PM  [facility]   Office  [phone]

## 2016-02-28 IMAGING — XA IR SHUNTOGRAM/ FISTULAGRAM
1 series · 13 of 24 positions shown · IV contrast (IODINE)
Comparison: None.

INDICATION: 43-year-old male with a history of renal failure.

He has reported increased venous pressures on dialysis treatments.
He has been referred for evaluation and possible treatment.
EXAM:
IR LEFT SHUNTOGRAM/FISTULAGRAM; PTA VENOUS

[Series 300: dsa  extremities · 13 of 64 slices shown]
[im 1/64]
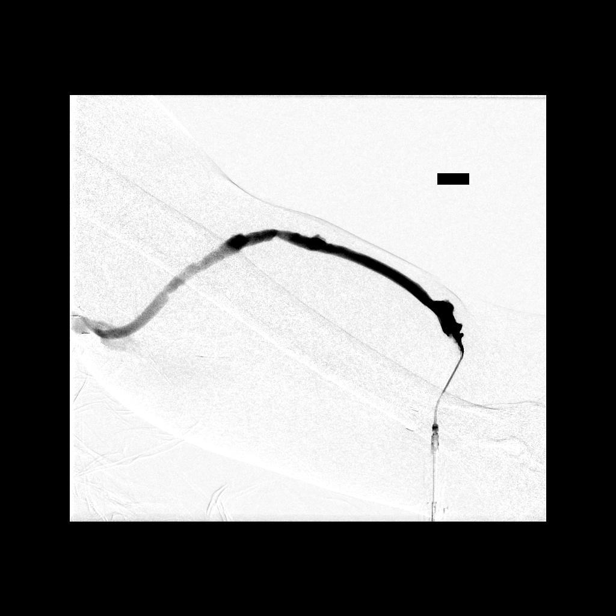
[im 6/64]
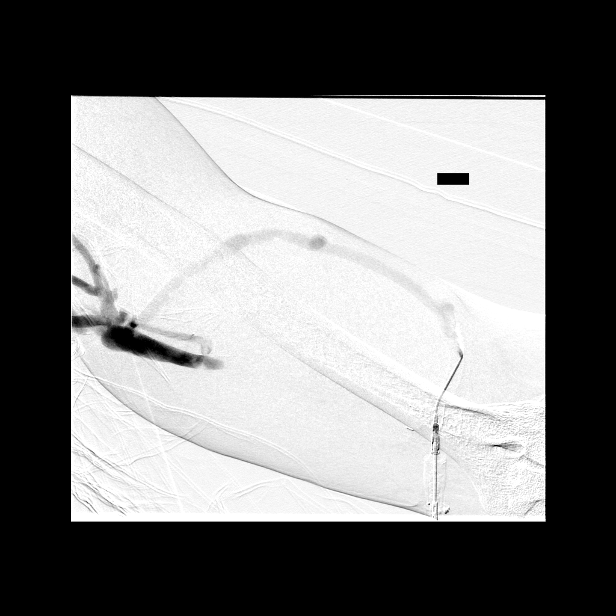
[im 11/64]
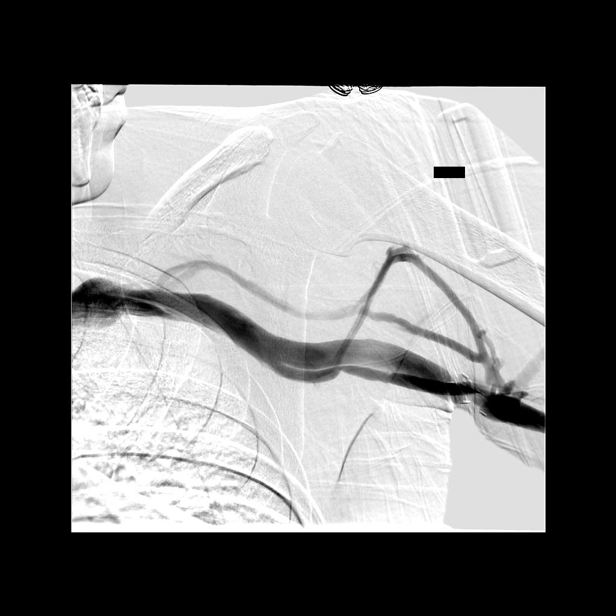
[im 17/64]
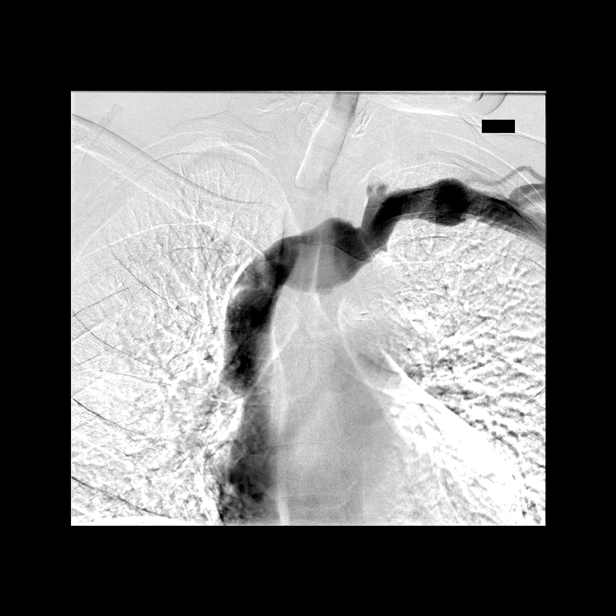
[im 22/64]
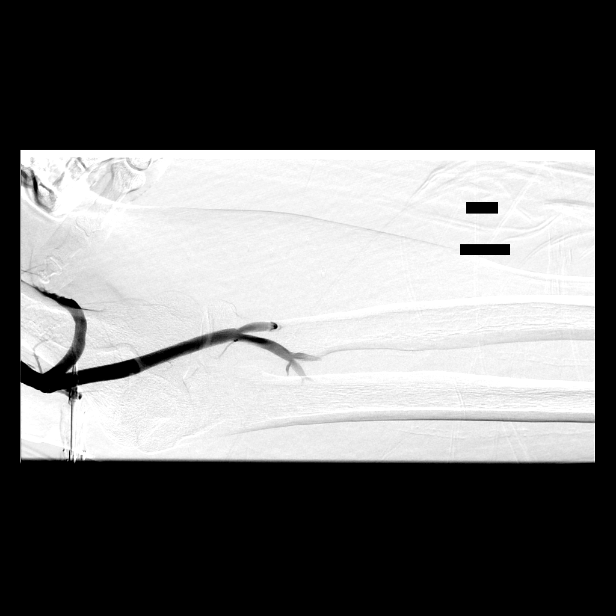
[im 28/64]
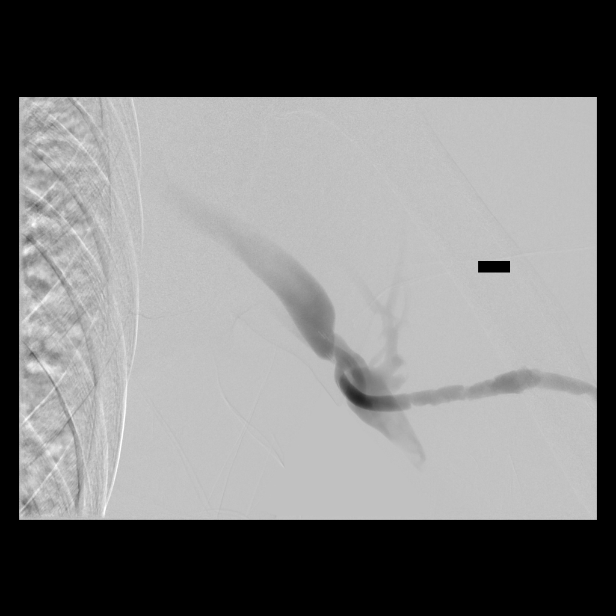
[im 33/64]
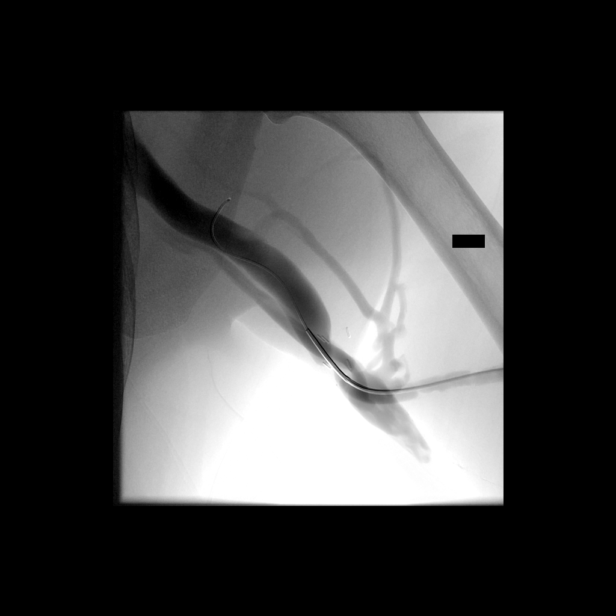
[im 36/64]
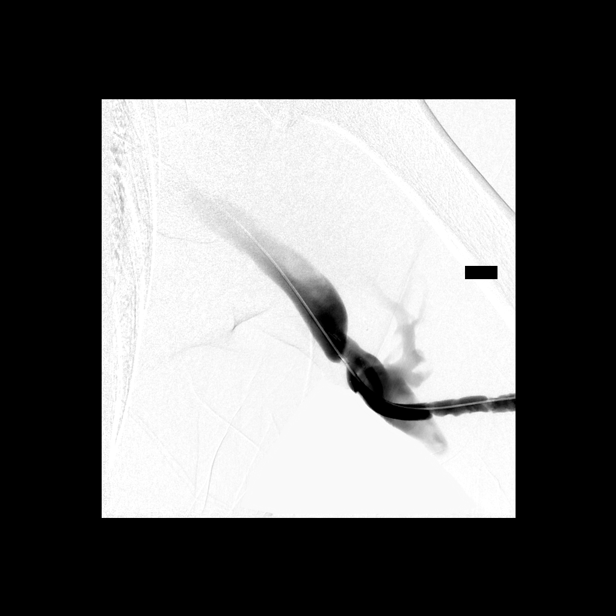
[im 42/64]
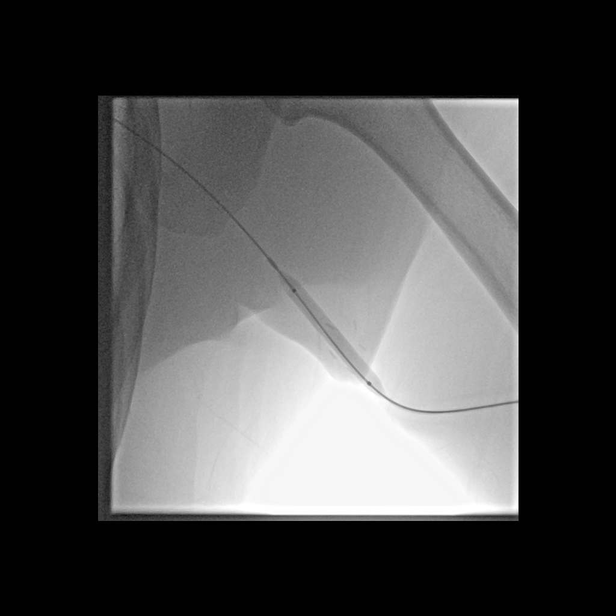
[im 47/64]
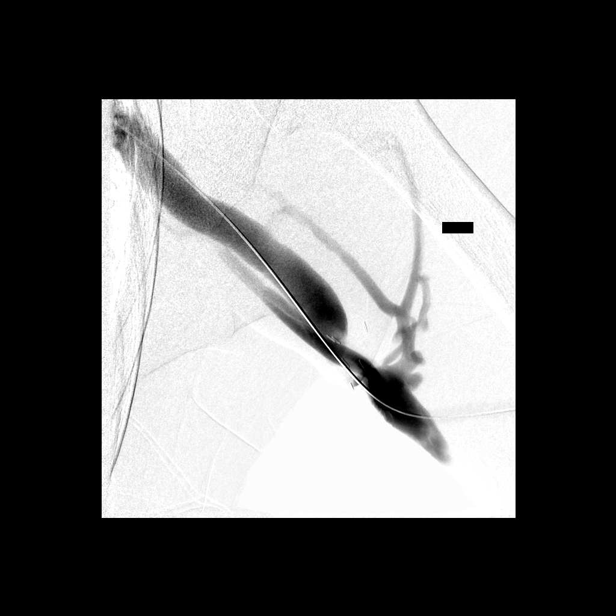
[im 53/64]
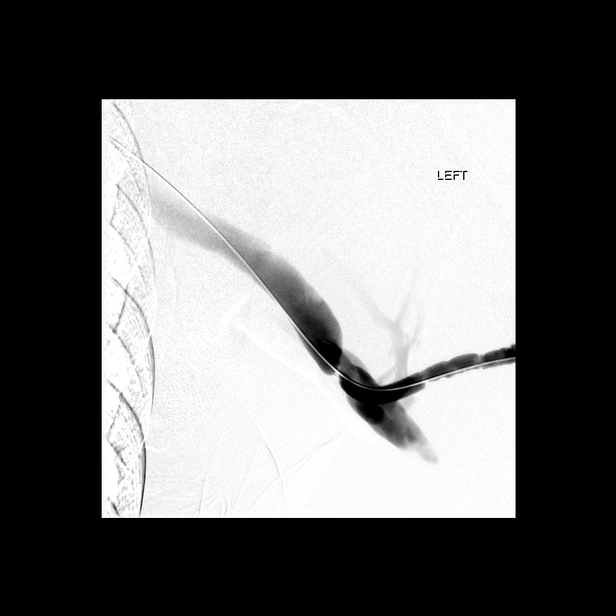
[im 58/64]
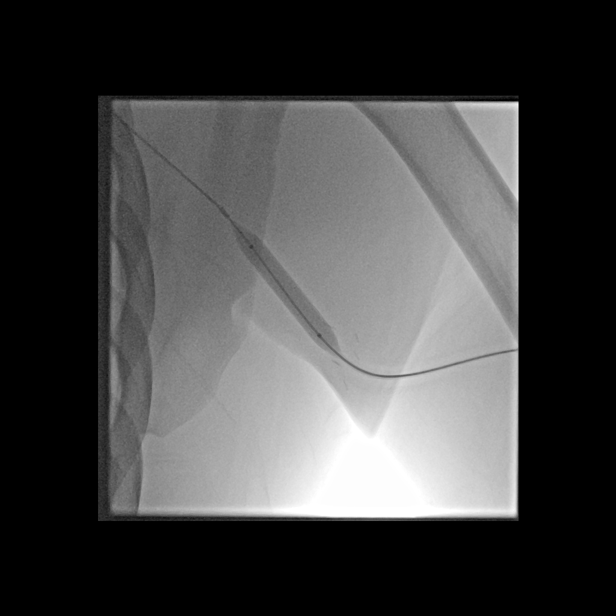
[im 64/64]
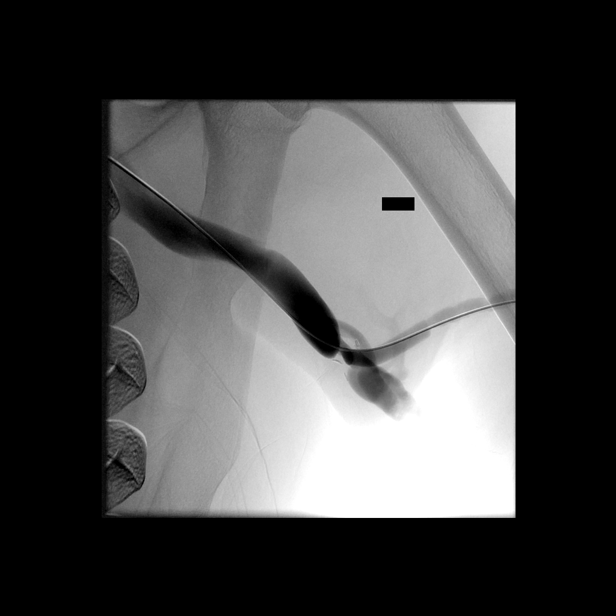

[13 of 24 positions shown; findings below may reference images not displayed]

MEDICATIONS:
75 mcg fentanyl

CONTRAST:  60mL OMNIPAQUE IOHEXOL 300 MG/ML  SOLN

ANESTHESIA/SEDATION:
Sedation time

Zero minutes

FLUOROSCOPY TIME:  3 minutes, 48 seconds minutes

COMPLICATIONS:
None immediate

PROCEDURE:
Informed written consent was obtained from the patient after a
discussion of the risk, benefits and alternatives to treatment.
Questions regarding the procedure were encouraged and answered. A
timeout was performed prior to the initiation of the procedure.

The left arm dialysis graft was prepped with Betadine in a sterile
fashion, and a sterile drape was applied covering the operative
field. A diagnostic shunt study was performed via an 18 gauge
angiocatheter introduced into venous outflow. Venous drainage was
assessed to the level of the central veins in the chest. Proximal
shunt was studied by reflux maneuver with temporary compression of
venous outflow.

A potential region of narrowing was identified at the venous
anastomosis of the graft, near the axilla. This region is identified
with the presence of surgical clips.

The angiocath was removed and replaced with a 6-French sheath over a
guidewire.

Serial balloon angioplasty of the surgical anastomosis site was then
performed with a 6 mm x 4 cm Conquest balloon, and then an 8 mm x 4
cm Conquest balloon. A completion shuntogram was performed.

The sheath was removed and hemostasis obtained with application of a
2-0 Ethilon pursestring suture which will be removed at the
patient's next dialysis session. A dressing was placed. The patient
tolerated the procedure well without immediate postprocedural
complication.
FINDINGS: Initial shunt venogram demonstrates patent graft with some
regularity secondary to multiple treatment punctures. There is a
stenosis in the region of the axilla, at the site of surgical
anastomosis of the graft and the brachial vein. There is collateral
flow at this site felt to be related to a narrowing.

The brachycephalic and superior vena cava are widely patent with no
central stenosis identified. Reflux of the arterial anastomosis
demonstrates widely patent anastomosis.

6 mm balloon angioplasty and then 8 mm balloon angioplasty
demonstrates resolution of small waist at the venous anastomosis.
Venous flow appears to improve after balloon angioplasty.

Of note, the narrowing is within 1-2 cm of the surgical site. , with
the inability to dilate across the graft-to-venous anastomosis above
the diameter of the synthetic graft.

Improved flow at the completion of the angioplasty.

Palpable pulse through the length of the graft.
IMPRESSION: Status post shuntogram and balloon angioplasty at the venous
anastomosis of graft to the proximal brachial vein. Improved flow
after balloon angioplasty with palpable pulse at the completion of
the exam.

ACCESS:
This access remains amenable to future percutaneous interventions as
clinically indicated, however, given that the narrowing occurs just
central to the surgical anastomosis, there is concern for disruption
at the surgical site if balloon dilation is performed beyond the
diameter of the synthetic graft.
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
## 2017-01-28 IMAGING — US IR TRANSCATH PLC STENT 1ST ART NOT LE CV CAR VERT CAR
1 series · 1 of 1 positions shown · non-contrast
Comparison: None.

INDICATION: Clotted graft. Patient with history of multiple previous declot
procedures performed most recently on 03/08/2015 (as well as
02/18/2015 and 01/08/2015) all of which were secondary to an early
recurrent narrowing/occlusion of the venous anastomosis.

EXAM:
1. FISTULALYSIS
2. ANGIOPLASTY OF VENOUS LIMB AND VENOUS ANASTOMOSIS
3. FLUOROSCOPIC GUIDED UNCOVERED STENT PLACEMENT AT THE LEVEL OF THE
VENOUS ANASTOMOSIS FOR EARLY RECURRENT OCCLUSIVE NARROWING OF THE
VENOUS ANASTOMOSIS
4. ULTRASOUND GUIDANCE FOR VENOUS ACCESS
TECHNIQUE: Informed written consent was obtained from the patient after a
discussion of the risks, benefits and alternatives to treatment.
Questions regarding the procedure were encouraged and answered. A
timeout was performed prior to the initiation of the procedure.

[Series 1: ir (id) (id)/(id) · 1 of 1 slices shown]
[im 1/1]
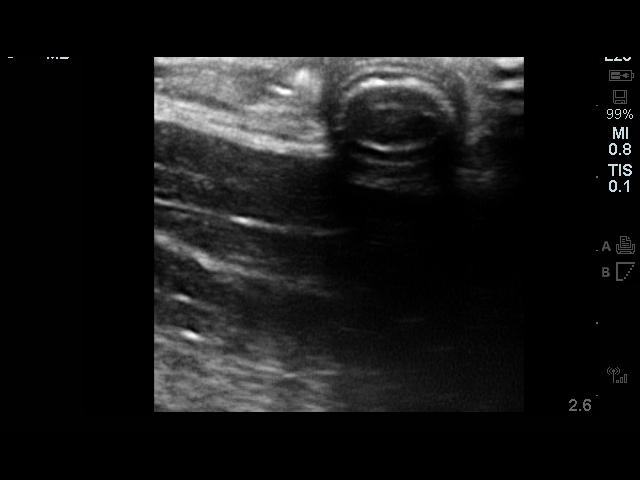

[1 of 1 positions shown; findings below may reference images not displayed]

MEDICATIONS:
Versed 4 mg IV; Fentanyl 200 mcg IV; Heparin 7111 units IV; TPA 4 mg
into graft.

CONTRAST:  40 cc Omnipaque 300

ANESTHESIA/SEDATION:
Total Moderate Sedation Time

70 minutes

FLUOROSCOPY TIME:  12 minutes 48 seconds (12 mGy)

COMPLICATIONS:
None immediate
On physical examination, the existing left upper arm arm dialysis
graft was negative for palpable pulse or thrill. The skin overlying
the graft was prepped and draped in the usual sterile fashion, and a
sterile drape was applied covering the operative field. Maximum
barrier sterile technique with sterile gowns and gloves were used
for the procedure.

Under ultrasound guidance, the dialysis graft was accessed directed
towards the venous anastomosis with a micropuncture kit after the
overlying soft tissues were anesthetized with 1% lidocaine. An
ultrasound image was saved for documentation purposes. The
micropuncture sheath was exchange for a 7-French vascular sheath
over a guidewire. Over a Benson wire, a Kumpe catheter was advanced
centrally and a central venogram was performed. Pull back venogram
was performed with the Kumpe catheter. Heparin was administered
systemically and TPA was administered via the Kumpe catheter
throughout near the entirety of the venous limb.

The venous anastomosis and majority of the venous limb was
angioplastied with a 7mm x 4 cm Conquest balloon. An additional
access was obtained directed towards the arterial anastomoses with a
micropuncture kit after the overlying soft tissues anesthetized with
1% lidocaine. This allowed for placement of a 6-French vascular
sheath. The graft was thrombectomized with several rounds of
push-pull mechanical thrombectomy with an occlusion balloon. Flow
was restored to the graft as evidenced by restored pulsatility of
the graft and brisk blood return from the side arm of the vascular
sheath. Shuntograms were performed.

Repeat angioplasty was performed of a residual moderate (approximate
50%) luminal narrowing of the venous anastomosis. Post angioplasty
shuntogram images were obtained demonstrating a residual
hemodynamically significant narrowing. As such, a 8 x 60 mm
uncovered smart flex stent was deployed across the venous
anastomosis. The peripheral end of the stent was angioplastied at
multiple stations with a 7 mm diameter Conquest balloon. The central
aspect of the uncovered stent was angioplastied at multiple stations
with an 8 mm angioplasty balloon.

Completion shuntogram images demonstrate a technically excellent
result with a minimal amount of residual stenosis about the
peripheral aspect of the stent, not resulting in hemodynamically
significant stenosis.

At this point, the procedure was terminated. All wires, catheters
and sheaths were removed from the patient. Hemostasis was achieved
at both access sites with deployment of a swizzle sutures which will
be removed at the patient's next dialysis session. Dressings were
placed. The patient tolerated the procedure without immediate
postprocedural complication.
FINDINGS: The existing left upper arm AV graft is thrombosed. The venous
anastomosis and near the entirety of the venous limb was
angioplastied to 7 mm diameter. The graft was successfully
thrombectomized using mechanical and pharmacologic means as above.

Despite repeat prolonged angioplasty of the venous anastomosis
ultimately to 8 mm diameter, a residual moderate (approximately 50%)
luminal narrowing of the venous anastomosis persisted and again was
felt to be the etiology of the patient's multiple early/recurrent
graft thrombosis. As such, the residual stenosis was treated with
deployment of an 8 x 60 mm uncovered stent which was angioplastied
at multiple stations, ultimately to 8 mm diameter.

Completion shuntogram demonstrates the venous limb and anastomosis
are widely patent. The central venous system, arterial limb and
arterial anastomosis are widely patent.
IMPRESSION: 1. Technically successful left upper arm AV graft thrombolysis.
2. Successful angioplasty of the venous anastomosis and majority of
the venous limb to 7 mm diameter. A residual hemodynamically
significant stenosis of the venous anastomosis was ultimately
treated with deployment 8 x 60 mm uncovered stent. Completion
shuntogram demonstrates the venous limb and anastomosis are widely
patent.
3. The arterial anastomosis and arterial limb are widely patent.
4. The central venous system is widely patent.

ACCESS:
This graft would be amenable to future percutaneous intervention as
clinically indicated.
